# Patient Record
Sex: Male | Born: 1955 | Race: White | Hispanic: No | Marital: Married | Smoking: Former smoker
Health system: Southern US, Community
[De-identification: ages and names within clinical notes are randomized; demographics above are authoritative.]

## PROBLEM LIST (undated history)

## (undated) DIAGNOSIS — E785 Hyperlipidemia, unspecified: Secondary | ICD-10-CM

## (undated) DIAGNOSIS — K219 Gastro-esophageal reflux disease without esophagitis: Secondary | ICD-10-CM

## (undated) DIAGNOSIS — K759 Inflammatory liver disease, unspecified: Secondary | ICD-10-CM

## (undated) DIAGNOSIS — I10 Essential (primary) hypertension: Secondary | ICD-10-CM

## (undated) DIAGNOSIS — G40909 Epilepsy, unspecified, not intractable, without status epilepticus: Secondary | ICD-10-CM

## (undated) DIAGNOSIS — I499 Cardiac arrhythmia, unspecified: Secondary | ICD-10-CM

## (undated) DIAGNOSIS — T7840XA Allergy, unspecified, initial encounter: Secondary | ICD-10-CM

## (undated) HISTORY — DX: Epilepsy, unspecified, not intractable, without status epilepticus: G40.909

## (undated) HISTORY — DX: Hyperlipidemia, unspecified: E78.5

## (undated) HISTORY — DX: Cardiac arrhythmia, unspecified: I49.9

## (undated) HISTORY — DX: Allergy, unspecified, initial encounter: T78.40XA

## (undated) HISTORY — DX: Inflammatory liver disease, unspecified: K75.9

## (undated) HISTORY — DX: Essential (primary) hypertension: I10

## (undated) HISTORY — DX: Gastro-esophageal reflux disease without esophagitis: K21.9

---

## 1997-10-06 ENCOUNTER — Emergency Department (HOSPITAL_COMMUNITY): Admission: EM | Admit: 1997-10-06 | Discharge: 1997-10-06 | Payer: Self-pay | Admitting: Emergency Medicine

## 2007-09-28 ENCOUNTER — Observation Stay (HOSPITAL_COMMUNITY): Admission: EM | Admit: 2007-09-28 | Discharge: 2007-09-29 | Payer: Self-pay | Admitting: Emergency Medicine
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
## 2010-11-22 LAB — COMPREHENSIVE METABOLIC PANEL
AST: 45 — ABNORMAL HIGH
Albumin: 4.1
Alkaline Phosphatase: 63
BUN: 8
Calcium: 9.4
Chloride: 103
Creatinine, Ser: 0.75
Creatinine, Ser: 0.79
GFR calc Af Amer: >60
GFR calc non Af Amer: >60
Glucose, Bld: 87
Potassium: 4.1
Total Bilirubin: 0.7
Total Protein: 7

## 2010-11-22 LAB — CK TOTAL AND CKMB (NOT AT ARMC)
CK, MB: 1.9
CK, MB: 2.3
Relative Index: 0.9
Relative Index: 1
Relative Index: 1.2
Total CK: 212
Total CK: 266 — ABNORMAL HIGH
Total CK: 270 — ABNORMAL HIGH

## 2010-11-22 LAB — CBC
HCT: 43
HCT: 43.8
Hemoglobin: 15.1
MCHC: 35.3
MCV: 93.3
MCV: 95
Platelets: 221
RBC: 4.61
WBC: 7
WBC: 8.6

## 2010-11-22 LAB — DIFFERENTIAL
Basophils Absolute: 0
Basophils Relative: 1
Eosinophils Absolute: 0.2
Eosinophils Relative: 2
Lymphocytes Relative: 36
Lymphs Abs: 2.8
Monocytes Relative: 9
Neutro Abs: 3.8
Neutrophils Relative %: 54

## 2010-11-22 LAB — URINALYSIS, ROUTINE W REFLEX MICROSCOPIC
Ketones, ur: NEGATIVE
Nitrite: NEGATIVE
pH: 6.5

## 2010-11-22 LAB — PROTIME-INR
INR: 0.9
Prothrombin Time: 12.7

## 2010-11-22 LAB — TSH: TSH: 1.305

## 2010-11-22 LAB — POCT CARDIAC MARKERS
Troponin i, poc: <0.05
Troponin i, poc: <0.05

## 2010-11-22 LAB — APTT: aPTT: 28

## 2011-03-29 ENCOUNTER — Ambulatory Visit (INDEPENDENT_AMBULATORY_CARE_PROVIDER_SITE_OTHER): Payer: PRIVATE HEALTH INSURANCE | Admitting: Family Medicine

## 2011-03-29 DIAGNOSIS — R350 Frequency of micturition: Secondary | ICD-10-CM

## 2011-03-29 DIAGNOSIS — R300 Dysuria: Secondary | ICD-10-CM

## 2011-03-29 DIAGNOSIS — Z202 Contact with and (suspected) exposure to infections with a predominantly sexual mode of transmission: Secondary | ICD-10-CM

## 2011-03-29 DIAGNOSIS — M2550 Pain in unspecified joint: Secondary | ICD-10-CM

## 2011-03-29 DIAGNOSIS — I10 Essential (primary) hypertension: Secondary | ICD-10-CM

## 2011-03-29 DIAGNOSIS — B1920 Unspecified viral hepatitis C without hepatic coma: Secondary | ICD-10-CM

## 2011-03-29 DIAGNOSIS — N410 Acute prostatitis: Secondary | ICD-10-CM

## 2011-03-29 DIAGNOSIS — K219 Gastro-esophageal reflux disease without esophagitis: Secondary | ICD-10-CM | POA: Insufficient documentation

## 2011-03-29 DIAGNOSIS — R509 Fever, unspecified: Secondary | ICD-10-CM

## 2011-03-29 DIAGNOSIS — E785 Hyperlipidemia, unspecified: Secondary | ICD-10-CM

## 2011-03-29 LAB — POCT CBC
Granulocyte percent: 65.9 %G (ref 37–80)
HCT, POC: 45.9 % (ref 43.5–53.7)
Hemoglobin: 15.1 g/dL (ref 14.1–18.1)
Lymph, poc: 3.3 (ref 0.6–3.4)
MCH, POC: 31 pg (ref 27–31.2)
MCHC: 32.9 g/dL (ref 31.8–35.4)
MCV: 94.2 fL (ref 80–97)
MID (cbc): 1.6 — AB (ref 0–0.9)
MPV: 8.8 fL (ref 0–99.8)
POC Granulocyte: 9.5 — AB (ref 2–6.9)
POC LYMPH PERCENT: 23.1 % (ref 10–50)
POC MID %: 11 %M (ref 0–12)
Platelet Count, POC: 226 10*3/uL (ref 142–424)
RBC: 4.87 M/uL (ref 4.69–6.13)
RDW, POC: 13.2 %
WBC: 14.4 10*3/uL — AB (ref 4.6–10.2)

## 2011-03-29 LAB — COMPREHENSIVE METABOLIC PANEL
AST: 29 U/L (ref 0–37)
Alkaline Phosphatase: 73 U/L (ref 39–117)
BUN: 14 mg/dL (ref 6–23)
Creat: 1.07 mg/dL (ref 0.50–1.35)
Potassium: 4.5 mEq/L (ref 3.5–5.3)

## 2011-03-29 LAB — COMPREHENSIVE METABOLIC PANEL WITH GFR
ALT: 33 U/L (ref 0–53)
Albumin: 5.1 g/dL (ref 3.5–5.2)
CO2: 30 meq/L (ref 19–32)
Calcium: 9.7 mg/dL (ref 8.4–10.5)
Chloride: 99 meq/L (ref 96–112)
Glucose, Bld: 89 mg/dL (ref 70–99)
Sodium: 138 meq/L (ref 135–145)
Total Bilirubin: 0.7 mg/dL (ref 0.3–1.2)
Total Protein: 8.2 g/dL (ref 6.0–8.3)

## 2011-03-29 LAB — POCT UA - MICROSCOPIC ONLY
Casts, Ur, LPF, POC: NEGATIVE
Crystals, Ur, HPF, POC: NEGATIVE
Mucus, UA: NEGATIVE
Yeast, UA: NEGATIVE

## 2011-03-29 LAB — POCT URINALYSIS DIPSTICK
Bilirubin, UA: NEGATIVE
Blood, UA: NEGATIVE
Glucose, UA: NEGATIVE
Ketones, UA: NEGATIVE
Leukocytes, UA: NEGATIVE
Nitrite, UA: NEGATIVE
Protein, UA: 30
Spec Grav, UA: 1.02
Urobilinogen, UA: 1
pH, UA: 7

## 2011-03-29 MED ORDER — CIPROFLOXACIN HCL 500 MG PO TABS: 500.0000 mg | ORAL_TABLET | Freq: Two times a day (BID) | ORAL | Status: AC

## 2011-03-29 NOTE — Progress Notes (Signed)
  Subjective:    Patient ID: [place] Mcguire, male    DOB: February 14, 1956, 56 y.o.   MRN: [id number]  Urinary Frequency  This is a new problem. The current episode started in the past 7 days. The problem occurs intermittently. The problem has been gradually worsening. The quality of the pain is described as burning and stabbing. The pain is moderate. The maximum temperature recorded prior to his arrival was 101 - 101.9 F. The fever has been present for less than 1 day. He is not sexually active. There is no history of pyelonephritis. Associated symptoms include chills, flank pain, frequency, hesitancy and urgency. Pertinent negatives include no discharge, hematuria, nausea, possible pregnancy or vomiting.  Heartburn He complains of heartburn. He reports no abdominal pain, no belching, no chest pain, no coughing, no nausea or no wheezing. This is a chronic problem. The problem has been unchanged. The heartburn is of mild intensity. The symptoms are aggravated by caffeine, stress and certain foods. Pertinent negatives include no anemia, fatigue, melena, muscle weakness, orthopnea or weight loss. Risk factors include caffeine use. He has tried a PPI for the symptoms. The treatment provided moderate relief.   He is a Naval architect. Married, in a monogamous relationship.  Review of Systems  Constitutional: Positive for chills. Negative for weight loss and fatigue.  Respiratory: Negative for cough, chest tightness, shortness of breath and wheezing.   Cardiovascular: Negative for chest pain and palpitations.  Gastrointestinal: Positive for heartburn. Negative for nausea, vomiting, abdominal pain and melena.  Genitourinary: Positive for dysuria, hesitancy, urgency, frequency and flank pain. Negative for hematuria, discharge, penile swelling, scrotal swelling, genital sores, penile pain and testicular pain.  Musculoskeletal: Negative for muscle weakness.       Objective:   Physical Exam  Constitutional: He  is oriented to person, place, and time. He appears well-developed and well-nourished.  HENT:  Head: Normocephalic and atraumatic.  Right Ear: External ear normal.  Left Ear: External ear normal.  Mouth/Throat: Oropharynx is clear and moist.  Eyes: EOM are normal. No scleral icterus.  Neck: Normal range of motion. Neck supple.  Cardiovascular: Normal rate, regular rhythm and normal heart sounds.  Exam reveals no gallop and no friction rub.   No murmur heard. Pulmonary/Chest: Effort normal and breath sounds normal. No respiratory distress. He has no wheezes. He has no rales. He exhibits no tenderness.  Abdominal: Soft. Bowel sounds are normal. He exhibits no distension and no mass. There is no tenderness. There is no rebound and no guarding.  Musculoskeletal: Normal range of motion.  Neurological: He is alert and oriented to person, place, and time.  Skin: Skin is warm and dry.  Psychiatric: He has a normal Mcguire and affect.    + enlarged prostate, tender, slightly tender epidydimis + stenotic urethral meatus vs just ? Stenotic uncircumcised foreskin      Assessment & Plan:  1. Fever/Chills-CBC showed 14.4 WBC most likely due to acute bacterial prostatitis. Pateint rx Cipro 500 mg BID x 14 days. 2. Dysuria/Frequency-UA with micro normal, will cx 3. H/o Hep C-CMP, aske dpt to return to get full PE 4. STD screening-Pt declnes HIV, will only do RPR, G/C 5. 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

## 2011-03-30 LAB — RPR

## 2011-03-30 NOTE — Patient Instructions (Signed)
Prostatitis Prostatitis is an inflammation (the body's way of reacting to injury and/or infection) of the prostate gland. The prostate gland is a male organ. The gland is about the size and shape of a walnut. The prostate is located just below the bladder. It produces semen, which is a fluid that helps nourish and transport sperm. Prostatitis is the most common urinary tract problem in men younger than age 54. There are 4 categories of prostatitis:  I - Acute bacterial prostatitis.   II - Chronic bacterial prostatitis.   III - Chronic prostatitis and chronic pelvic pain syndrome (CPPS).   Inflammatory.   Non inflammatory.   IV - Asymptomatic inflammatory prostatitis.  Acute and chronic bacterial prostatitis are problems with bacterial infections of the prostate. "Acute" infection is usually a one-time problem. "Chronic" bacterial prostatitis is a condition with recurrent infection. It is usually caused by the same germ(bacteria). CPPS has symptoms similar to prostate infection. However, no infection is actually found. This condition can cause problems of ongoing pain. Currently, it cannot be cured. Treatments are available and aimed at symptom control.  Asymptomatic inflammatory prostatitis has no symptoms. It is a condition where infection-fighting cells are found by chance in the urine. The diagnosis is made most often during an exam for other conditions. Other conditions could be infertility or a high level of PSA (prostate-specific antigen) in the blood. SYMPTOMS  Symptoms can vary depending upon the type of prostatitis that exists. There can also be overlap in symptoms. This can make diagnosis difficult. Symptoms: For Acute bacterial prostatitis  Painful urination.   Fever or chills.   Muscle or joint pains.   Low back pain.   Low abdominal pain.   Inability to empty bladder completely.   Sudden urges to urinate.   Frequent urination during the day.   Difficulty starting  urine stream.   Need to urinate several times at night (nocturia).   Weak urine stream.   Urethral (tube that carries urine from the bladder out of the body) discharge and dribbling after urination.  For Chronic bacterial prostatitis  Rectal pain.   Pain in the testicles, penis, or tip of the penis.   Pain in the space between the anus and scrotum (perineum).   Low back pain.   Low abdominal pain.   Problems with sexual function.   Painful ejaculation.   Bloody semen.   Inability to empty bladder completely.   Painful urination.   Sudden urges to urinate.   Frequent urination during the day.   Difficulty starting urine stream.   Need to urinate several times at night (nocturia).   Weak urine stream.   Dribbling after urination.   Urethral discharge.  For Chronic prostatitis and chronic pelvic pain syndrome (CPPS) Symptoms are the same as those for chronic bacterial prostatitis. Problems with sexual function are often the reason for seeking care. This important problem should be discussed with your caregiver. For Asymptomatic inflammatory prostatitis As noted above, there are no symptoms with this condition. DIAGNOSIS   Your caregiver may perform a rectal exam. This exam is to determine if the prostate is swollen and tender.   Sometimes blood work is performed. This is done to see if your white blood cell count is elevated. The Prostate Specific Antigen (PSA) is also measured. PSA is a blood test that can help detect early prostate cancer.   A urinalysis is done to find out what type of infection is present if this is a suspected cause. An   additional urinalysis may be done after a digital rectal exam. This is to see if white blood cells are pushed out of the prostate and into the urine. A low-grade infection of the prostate may not be found on the first urinalysis.  In more difficult cases, your caregiver may advise other tests. Tests could include:  Urodynamics  -- Tests the function of the bladder and the organs involved in triggering and controlling normal urination.   Urine flow rate.   Cystoscopy -- In this procedure, a thin, telescope-like tube with a light and tiny camera attached (cystoscope) is inserted into the bladder through the urethra. This allows the caregiver to see the inside of the urethra and bladder.   Electromyography -- This procedure tests how the muscles and nerves of the bladder work. It is focused on the muscles that control the anus and pelvic floor. These are the muscles between the anus and scrotum.  In people who show no signs of infection, certain uncommon infections might be causing constant or recurrent symptoms. These uncommon infections are difficult to detect. More work in medicine may help find solutions to these problems. TREATMENT  Antibiotics are used to treat infections caused by germs. If the infection is not treated and becomes long lasting (chronic), it may become a lower grade infection with minor, continual problems. Without treatment, the prostate may develop a boil or furuncle (abscess). This may require surgical treatment. For those with chronic prostatitis and CPPS, it is important to work closely with your primary caregiver and urologist. For some, the medicines that are used to treat a non-cancerous, enlarged prostate (benign prostatic hypertrophy) may be helpful. Referrals to specialists other than urologists may be necessary. In rare cases when all treatments have been inadequate for pain control, an operation to remove the prostate may be recommended. This is very rare and before this is considered thorough discussion with your urologist is highly recommended.  In cases of secondary to chronic non-bacterial prostatitis, a good relationship with your urologist or primary caregiver is essential because it is often a recurrent prolonged condition that requires a good understanding of the causes and a commitment  to therapy aimed at controlling your symptoms. HOME CARE INSTRUCTIONS   Hot sitz baths for 20 minutes, 4 times per day, may help relieve pain.   Non-prescription pain killers may be used as your caregiver recommends if you have no allergies to them. Some illnesses or conditions prevent use of non-prescription drugs. If unsure, check with your caregiver. Take all medications as directed. Take the antibiotics for the prescribed length of time, even if you are feeling better.  SEEK MEDICAL CARE IF:   You have any worsening of the symptoms that originally brought you to your caregiver.   You have an oral temperature above 102 F (38.9 C).   You experience any side effects from medications prescribed.  SEEK IMMEDIATE MEDICAL CARE IF:   You have an oral temperature above 102 F (38.9 C), not controlled by medicine.   You have pain not relieved with medications.   You develop nausea, vomiting, lightheadedness, or have a fainting episode.   You are unable to urinate.   You pass bloody urine or clots.  Document Released: 02/08/2000 Document Revised: 10/23/2010 Document Reviewed: 08/29/2008 [employer] Patient Information 2012 [employer]. 

## 2011-03-31 LAB — GC/CHLAMYDIA PROBE AMP, URINE
Chlamydia, Swab/Urine, PCR: NEGATIVE
GC Probe Amp, Urine: NEGATIVE

## 2011-04-01 LAB — URINE CULTURE: Colony Count: 50000

## 2011-04-12 ENCOUNTER — Telehealth: Payer: Self-pay | Admitting: Family Medicine

## 2011-04-12 NOTE — Telephone Encounter (Signed)
Left a message for patient ot call me back, he did and we spoke about his lab results. He is feeling much better on cipro, he actually went to work. Today is his last day of cipro ( 14/14 days) . He was referred to urology but it is out of network so would like a referral to [facility] on [facility] which would be in network.

## 2012-04-27 ENCOUNTER — Encounter: Payer: Self-pay | Admitting: *Deleted

## 2012-05-24 ENCOUNTER — Encounter: Payer: Self-pay | Admitting: Internal Medicine

## 2012-10-13 ENCOUNTER — Other Ambulatory Visit: Payer: Self-pay | Admitting: Internal Medicine

## 2012-10-13 NOTE — Telephone Encounter (Signed)
Rx was sent to pharmacy electronically. 

## 2012-11-02 ENCOUNTER — Other Ambulatory Visit: Payer: Self-pay | Admitting: Internal Medicine

## 2012-11-03 NOTE — Telephone Encounter (Signed)
Rx was sent to pharmacy electronically. 

## 2012-11-05 ENCOUNTER — Ambulatory Visit: Payer: Self-pay | Admitting: Internal Medicine

## 2012-11-12 ENCOUNTER — Encounter: Payer: Self-pay | Admitting: Internal Medicine

## 2012-11-12 ENCOUNTER — Ambulatory Visit (INDEPENDENT_AMBULATORY_CARE_PROVIDER_SITE_OTHER): Payer: PRIVATE HEALTH INSURANCE | Admitting: Internal Medicine

## 2012-11-12 VITALS — BP 116/84 | HR 65 | Ht 67.5 in | Wt 179.7 lb

## 2012-11-12 DIAGNOSIS — Z79899 Other long term (current) drug therapy: Secondary | ICD-10-CM

## 2012-11-12 DIAGNOSIS — E785 Hyperlipidemia, unspecified: Secondary | ICD-10-CM

## 2012-11-12 DIAGNOSIS — I10 Essential (primary) hypertension: Secondary | ICD-10-CM

## 2012-11-12 MED ORDER — HYDROCHLOROTHIAZIDE 25 MG PO TABS: ORAL_TABLET | ORAL | Status: DC

## 2012-11-12 MED ORDER — SIMVASTATIN 40 MG PO TABS: 40.0000 mg | ORAL_TABLET | Freq: Every evening | ORAL | Status: DC

## 2012-11-12 MED ORDER — LOSARTAN POTASSIUM 50 MG PO TABS: ORAL_TABLET | ORAL | Status: DC

## 2012-11-12 NOTE — Patient Instructions (Addendum)
Your physician wants you to follow-up in: 1 year. You will receive a reminder letter in the mail two months in advance. If you don't receive a letter, please call our office to schedule the follow-up appointment.  Your physician recommends that you return for lab work in the next few weeks. You will need to be fasting for this blood work.

## 2012-11-12 NOTE — Progress Notes (Signed)
OFFICE NOTE  Chief Complaint:  Routine office visit  Primary Care Physician: No primary provider on file.  HPI:  [place] Mcguire is a 57 year old gentleman who I see annually for hypertension which has been well controlled, as well as dyslipidemia. 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 He had managed to lose some weight, however, has gained some of that back. His blood pressures remain stable. He does not do any specific exercise but is active. He denies any chest pain, shortness of breath, palpitations, presyncope, syncopal symptoms.   PMHx:  Past Medical History  Diagnosis Date  . Allergy   . Hypertension   . GERD (gastroesophageal reflux disease)   . Epilepsy   . Hepatitis   . Dyslipidemia   . Dysrhythmia     History reviewed. No pertinent past surgical history.  FAMHx:  Family History  Problem Relation Age of Onset  . Cancer Father     SOCHx:   reports that he quit smoking about 30 years ago. His smoking use included Cigarettes. He has a 2 pack-year smoking history. He has never used smokeless tobacco. He reports that he does not drink alcohol or use illicit drugs.  ALLERGIES:  No Known Allergies  ROS: A comprehensive review of systems was negative.  HOME MEDS: Current Outpatient Prescriptions  Medication Sig Dispense Refill  . aspirin 81 MG tablet Take 81 mg by mouth daily.       Marland Kitchen CALCIUM PO Take by mouth daily.      . fish oil-omega-3 fatty acids 1000 MG capsule Take 1 g by mouth 2 (two) times daily.       . hydrochlorothiazide (HYDRODIURIL) 25 MG tablet TAKE 1 TABLET DAILY  90 tablet  3  . losartan (COZAAR) 50 MG tablet TAKE 1 TABLET DAILY  90 tablet  3  . Multiple Vitamins-Minerals (MULTIVITAMIN WITH MINERALS) tablet Take 1 tablet by mouth daily.      Marland Kitchen omeprazole (PRILOSEC) 10 MG capsule Take 20 mg by mouth 2 (two) times daily.       . simvastatin (ZOCOR) 40 MG tablet Take 1 tablet  (40 mg total) by mouth every evening.  90 tablet  3   No current facility-administered medications for this visit.    LABS/IMAGING: No results found for this or any previous visit (from [place] past 48 hour(s)). No results found.  VITALS: BP 116/84  Pulse 65  Ht 5' 7.5" (1.715 m)  Wt 179 lb 11.2 oz (81.511 kg)  BMI 27.71 kg/m2  EXAM: General appearance: alert and no distress Neck: no adenopathy, no carotid bruit, no JVD, supple, symmetrical, trachea midline and thyroid not enlarged, symmetric, no tenderness/mass/nodules Lungs: clear to auscultation bilaterally Heart: regular rate and rhythm, S1, S2 normal, no murmur, click, rub or gallop Abdomen: soft, non-tender; bowel sounds normal; no masses,  no organomegaly Extremities: extremities normal, atraumatic, no cyanosis or edema Pulses: 2+ and symmetric Skin: Skin color, texture, turgor normal. No rashes or lesions Neurologic: Grossly normal  EKG: Normal sinus rhythm at 65  ASSESSMENT: 1. Hypertension-controlled 2. Dyslipidemia-previously at goal  PLAN: 1.   [place] Mcguire is doing very well and asymptomatic. He's constantly busy lifting furniture is fairly active and denies any chest pain or shortness of breath. He is clothes out of [place] medications although did refill those today. He is also due for repeat blood work including lipid profile and CMP. He does have a  history of some mild liver enzyme elevation in [place] past which may be due to NAFL. We'll go ahead and recheck a CMP as well. Let us see him back annually or sooner as necessary.  [place] Nose, MD, [place]gu[place] Attending Cardiologist [place]  [place] Mcguire C 11/12/2012, 4:33 PM

## 2012-11-19 LAB — COMPREHENSIVE METABOLIC PANEL
ALT: 67 U/L — ABNORMAL HIGH (ref 0–53)
AST: 54 U/L — ABNORMAL HIGH (ref 0–37)
CO2: 29 mEq/L (ref 19–32)
Chloride: 100 mEq/L (ref 96–112)
Creat: 0.94 mg/dL (ref 0.50–1.35)
Sodium: 136 mEq/L (ref 135–145)
Total Bilirubin: 0.7 mg/dL (ref 0.3–1.2)
Total Protein: 7.5 g/dL (ref 6.0–8.3)

## 2012-11-22 LAB — NMR LIPOPROFILE WITH LIPIDS
Cholesterol, Total: 140 mg/dL (ref <200)
HDL Particle Number: 27.7 umol/L — ABNORMAL LOW (ref >=30.5)
HDL-C: 34 mg/dL — ABNORMAL LOW (ref >=40)
LDL (calc): 71 mg/dL (ref <100)
LDL Particle Number: 1670 nmol/L — ABNORMAL HIGH (ref <1000)
LDL Size: 19.7 nm — ABNORMAL LOW (ref >20.5)
LP-IR Score: 66 — ABNORMAL HIGH (ref <=45)
Small LDL Particle Number: 1226 nmol/L — ABNORMAL HIGH (ref <=527)
VLDL Size: 45 nm (ref <=46.6)

## 2012-11-25 ENCOUNTER — Encounter: Payer: Self-pay | Admitting: *Deleted

## 2012-11-25 ENCOUNTER — Telehealth: Payer: Self-pay | Admitting: *Deleted

## 2012-11-25 MED ORDER — ROSUVASTATIN CALCIUM 40 MG PO TABS: 40.0000 mg | ORAL_TABLET | Freq: Every day | ORAL | Status: DC

## 2012-11-25 NOTE — Telephone Encounter (Signed)
Message copied by Lindell Spar on Thu Nov 25, 2012  8:38 AM ------      Message from: Chrystie Nose      Created: Wed Nov 24, 2012  7:18 PM       Cholesterol content is not bad, but particle number remains high. I would recommend either changing to Crestor 40 mg or adding Zetia 10 mg to the Zocor.            Dr. Rennis Golden ------

## 2012-11-25 NOTE — Telephone Encounter (Signed)
Called patient with lab results - informed of need to switch or add medication - patient would like to try Crestor - medication ordered and sent to pharmacy.

## 2013-04-08 ENCOUNTER — Telehealth: Payer: Self-pay | Admitting: Internal Medicine

## 2013-04-08 ENCOUNTER — Other Ambulatory Visit: Payer: Self-pay | Admitting: *Deleted

## 2013-04-08 MED ORDER — ATORVASTATIN CALCIUM 80 MG PO TABS: 80.0000 mg | ORAL_TABLET | Freq: Every day | ORAL | Status: DC

## 2013-04-08 NOTE — Telephone Encounter (Signed)
Pt states he can not afford Crestor. Could DrHilty put him on something else or put him back on Simvastatin please..Marland Kitchen

## 2013-04-08 NOTE — Telephone Encounter (Signed)
Talked to Dr. Rennis GoldenHilty and it was decided to stop Crestor 40mg  and start Atorvastatin 80 mg daily ; Pt. Called and informed and informed and script sent electronically to mail order

## 2013-11-14 ENCOUNTER — Ambulatory Visit: Payer: PRIVATE HEALTH INSURANCE | Admitting: Internal Medicine

## 2013-11-25 ENCOUNTER — Encounter: Payer: Self-pay | Admitting: Internal Medicine

## 2013-11-25 ENCOUNTER — Ambulatory Visit (INDEPENDENT_AMBULATORY_CARE_PROVIDER_SITE_OTHER): Payer: PRIVATE HEALTH INSURANCE | Admitting: Internal Medicine

## 2013-11-25 VITALS — BP 128/86 | HR 69 | Ht 67.0 in | Wt 184.5 lb

## 2013-11-25 DIAGNOSIS — E785 Hyperlipidemia, unspecified: Secondary | ICD-10-CM

## 2013-11-25 DIAGNOSIS — I10 Essential (primary) hypertension: Secondary | ICD-10-CM

## 2013-11-25 DIAGNOSIS — Z79899 Other long term (current) drug therapy: Secondary | ICD-10-CM

## 2013-11-25 NOTE — Patient Instructions (Signed)
Your physician recommends that you return for lab work FASTING  Your physician wants you to follow-up in: 1 year with Dr. Hilty. You will receive a reminder letter in the mail two months in advance. If you don't receive a letter, please call our office to schedule the follow-up appointment.  

## 2013-11-25 NOTE — Progress Notes (Signed)
OFFICE NOTE  Chief Complaint:  Routine office visit  Primary Care Physician: No primary provider on file.  HPI:  [place] Mcguire is a 58 year old gentleman who I see annually for hypertension which has been well controlled, as well as dyslipidemia. 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 He had managed to lose some weight, however, has gained some of that back. His blood pressures remain stable. He does not do any specific exercise but is active. He denies any chest pain, shortness of breath, palpitations, presyncope, syncopal symptoms.   [place] Mcguire returns today for followup. He reports feeling very well. Denies any chest pain or shortness of breath. He reports he started eating a healthier diet and he seemed a little bit of weight loss. He is taking atorvastatin 80 mg now and is due for repeat test of his cholesterol. Blood pressures been well controlled. He reports a history of fatty liver the past and we may need to survey his liver enzymes.  PMHx:  Past Medical History  Diagnosis Date  . Allergy   . Hypertension   . GERD (gastroesophageal reflux disease)   . Epilepsy   . Hepatitis   . Dyslipidemia   . Dysrhythmia     History reviewed. No pertinent past surgical history.  FAMHx:  Family History  Problem Relation Age of Onset  . Cancer Father     SOCHx:   reports that he quit smoking about 31 years ago. His smoking use included Cigarettes. He has a 2 pack-year smoking history. He has never used smokeless tobacco. He reports that he does not drink alcohol or use illicit drugs.  ALLERGIES:  No Known Allergies  ROS: A comprehensive review of systems was negative.  HOME MEDS: Current Outpatient Prescriptions  Medication Sig Dispense Refill  . aspirin 81 MG tablet Take 81 mg by mouth daily.       Marland Kitchen. atorvastatin (LIPITOR) 80 MG tablet Take 1 tablet (80 mg total) by mouth daily.  90 tablet  3  .  CALCIUM PO Take 630 mg by mouth 2 (two) times daily.       . Chlorpheniramine Maleate (ALLERGY PO) Take by mouth at bedtime.      . Cholecalciferol (VITAMIN D-3) 1000 UNITS CAPS Take 1 capsule by mouth daily.      . fish oil-omega-3 fatty acids 1000 MG capsule Take 1 g by mouth 2 (two) times daily.       . hydrochlorothiazide (HYDRODIURIL) 25 MG tablet TAKE 1 TABLET DAILY  90 tablet  3  . losartan (COZAAR) 50 MG tablet TAKE 1 TABLET DAILY  90 tablet  3  . Multiple Vitamins-Minerals (MULTIVITAMIN WITH MINERALS) tablet Take 1 tablet by mouth daily.       No current facility-administered medications for this visit.    LABS/IMAGING: No results found for this or any previous visit (from the past 48 hour(s)). No results found.  VITALS: BP 128/86  Pulse 69  Ht 5\' 7"  (1.702 m)  Wt 184 lb 8 oz (83.689 kg)  BMI 28.89 kg/m2  EXAM: General appearance: alert and no distress Neck: no adenopathy, no carotid bruit, no JVD, supple, symmetrical, trachea midline and thyroid not enlarged, symmetric, no tenderness/mass/nodules Lungs: clear to auscultation bilaterally Heart: regular rate and rhythm, S1, S2 normal, no murmur, click, rub or gallop Abdomen: soft, non-tender; bowel sounds normal; no masses,  no organomegaly Extremities: extremities normal, atraumatic, no  cyanosis or edema Pulses: 2+ and symmetric Skin: Skin color, texture, turgor normal. No rashes or lesions Neurologic: Grossly normal  EKG: Normal sinus rhythm at 69  ASSESSMENT: 1. Hypertension-controlled 2. Dyslipidemia-due for re-check  PLAN: 1.   [place] Mcguire is doing very well and asymptomatic. He's constantly busy lifting furniture is fairly active and denies any chest pain or shortness of breath. As last office visit I increased his cholesterol medication and he is now due for repeat lipid profile. We'll order that as well as a liver profile based on his history of fatty liver in the past to make sure that there's been no  significant elevation in liver enzymes. Blood pressure is well controlled. Overall is doing well and is eating healthier diet and getting more activity. I've encouraged him to keep this up and will plan to see him back annually or sooner as necessary.  Chrystie Nose, MD, [facility] Attending Cardiologist [facility]  Eran Windish C 11/25/2013, 4:31 PM

## 2013-12-02 LAB — HEPATIC FUNCTION PANEL
ALT: 56 U/L — ABNORMAL HIGH (ref 0–53)
AST: 44 U/L — ABNORMAL HIGH (ref 0–37)
Albumin: 4.4 g/dL (ref 3.5–5.2)
Alkaline Phosphatase: 57 U/L (ref 39–117)
BILIRUBIN DIRECT: 0.1 mg/dL (ref 0.0–0.3)
BILIRUBIN INDIRECT: 0.5 mg/dL (ref 0.2–1.2)
Total Bilirubin: 0.6 mg/dL (ref 0.2–1.2)
Total Protein: 7.2 g/dL (ref 6.0–8.3)

## 2013-12-05 ENCOUNTER — Encounter: Payer: Self-pay | Admitting: *Deleted

## 2013-12-05 LAB — NMR LIPOPROFILE WITH LIPIDS
CHOLESTEROL, TOTAL: 126 mg/dL (ref 100–199)
HDL PARTICLE NUMBER: 27.5 umol/L — AB (ref >=30.5)
HDL Size: 8.7 nm — ABNORMAL LOW (ref >=9.2)
HDL-C: 33 mg/dL — ABNORMAL LOW (ref >39)
LDL CALC: 62 mg/dL (ref 0–99)
LDL Particle Number: 964 nmol/L (ref <1000)
LDL Size: 19.9 nm (ref >=20.8)
LP-IR SCORE: 71 — AB (ref <=45)
Large HDL-P: 2.4 umol/L — ABNORMAL LOW (ref >=4.8)
Large VLDL-P: 4.6 nmol/L — ABNORMAL HIGH (ref <=2.7)
Small LDL Particle Number: 619 nmol/L — ABNORMAL HIGH (ref <=527)
Triglycerides: 157 mg/dL — ABNORMAL HIGH (ref 0–149)
VLDL SIZE: 48.3 nm — AB (ref <=46.6)

## 2013-12-12 ENCOUNTER — Other Ambulatory Visit: Payer: Self-pay | Admitting: Internal Medicine

## 2013-12-12 NOTE — Telephone Encounter (Signed)
Rx was sent to pharmacy electronically. 

## 2013-12-14 ENCOUNTER — Other Ambulatory Visit: Payer: Self-pay | Admitting: Internal Medicine

## 2013-12-14 NOTE — Telephone Encounter (Signed)
Rx was sent to pharmacy electronically. 

## 2014-02-16 ENCOUNTER — Other Ambulatory Visit: Payer: Self-pay | Admitting: Internal Medicine

## 2014-02-16 NOTE — Telephone Encounter (Signed)
Rx refill sent to patient pharmacy   

## 2014-11-14 ENCOUNTER — Other Ambulatory Visit: Payer: Self-pay | Admitting: Internal Medicine

## 2014-11-14 NOTE — Telephone Encounter (Signed)
Rx request sent to pharmacy.  

## 2014-11-24 ENCOUNTER — Other Ambulatory Visit: Payer: Self-pay | Admitting: Internal Medicine

## 2014-11-24 NOTE — Telephone Encounter (Signed)
Rx request sent to pharmacy.  

## 2014-12-11 ENCOUNTER — Telehealth: Payer: Self-pay | Admitting: Internal Medicine

## 2014-12-14 ENCOUNTER — Ambulatory Visit: Payer: PRIVATE HEALTH INSURANCE | Admitting: Internal Medicine

## 2014-12-14 NOTE — Telephone Encounter (Signed)
Close encounter 

## 2014-12-22 ENCOUNTER — Encounter: Payer: Self-pay | Admitting: Internal Medicine

## 2014-12-22 ENCOUNTER — Ambulatory Visit (INDEPENDENT_AMBULATORY_CARE_PROVIDER_SITE_OTHER): Payer: PRIVATE HEALTH INSURANCE | Admitting: Internal Medicine

## 2014-12-22 VITALS — BP 116/82 | HR 68 | Ht 66.0 in | Wt 183.5 lb

## 2014-12-22 DIAGNOSIS — I10 Essential (primary) hypertension: Secondary | ICD-10-CM | POA: Diagnosis not present

## 2014-12-22 DIAGNOSIS — E785 Hyperlipidemia, unspecified: Secondary | ICD-10-CM

## 2014-12-22 DIAGNOSIS — I4510 Unspecified right bundle-branch block: Secondary | ICD-10-CM

## 2014-12-22 DIAGNOSIS — K219 Gastro-esophageal reflux disease without esophagitis: Secondary | ICD-10-CM

## 2014-12-22 DIAGNOSIS — Z79899 Other long term (current) drug therapy: Secondary | ICD-10-CM | POA: Diagnosis not present

## 2014-12-22 LAB — LIPID PANEL
CHOL/HDL RATIO: 4.2 ratio (ref <=5.0)
CHOLESTEROL: 126 mg/dL (ref 125–200)
HDL: 30 mg/dL — AB (ref >=40)
LDL Cholesterol: 66 mg/dL (ref <130)
TRIGLYCERIDES: 152 mg/dL — AB (ref <150)
VLDL: 30 mg/dL (ref <30)

## 2014-12-22 LAB — COMPREHENSIVE METABOLIC PANEL
ALBUMIN: 4.5 g/dL (ref 3.6–5.1)
ALK PHOS: 66 U/L (ref 40–115)
ALT: 46 U/L (ref 9–46)
AST: 46 U/L — ABNORMAL HIGH (ref 10–35)
BUN: 17 mg/dL (ref 7–25)
CALCIUM: 9.4 mg/dL (ref 8.6–10.3)
CO2: 28 mmol/L (ref 20–31)
Chloride: 101 mmol/L (ref 98–110)
Creat: 0.92 mg/dL (ref 0.70–1.33)
Glucose, Bld: 103 mg/dL — ABNORMAL HIGH (ref 65–99)
POTASSIUM: 3.9 mmol/L (ref 3.5–5.3)
Sodium: 138 mmol/L (ref 135–146)
TOTAL PROTEIN: 7.2 g/dL (ref 6.1–8.1)
Total Bilirubin: 0.8 mg/dL (ref 0.2–1.2)

## 2014-12-22 NOTE — Patient Instructions (Addendum)
Your physician recommends that you return for lab work FASTING (nothing to eat/drink after midnight)  Your physician wants you to follow-up in: 12 month with Dr. Rennis GoldenHilty. You will receive a reminder letter in the mail two months in advance. If you don't receive a letter, please call our office to schedule the follow-up appointment.  PCP - [facility] - Dr. Drue NovelPaz (if taking new patients)

## 2014-12-22 NOTE — Progress Notes (Signed)
OFFICE NOTE  Chief Complaint:  Routine office visit  Primary Care Physician: No PCP Per Patient  HPI:  Douglas Mcguire is a 59 year old gentleman who I see annually for hypertension which has been well controlled, as well as dyslipidemia. 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 He had managed to lose some weight, however, has gained some of that back. His blood pressures remain stable. He does not do any specific exercise but is active. He denies any chest pain, shortness of breath, palpitations, presyncope, syncopal symptoms.   Douglas Mcguire returns today for followup. He reports feeling very well. Denies any chest pain or shortness of breath. He reports he started eating a healthier diet and he seemed a little bit of weight loss. He is taking atorvastatin 80 mg now and is due for repeat test of his cholesterol. Blood pressures been well controlled. He reports a history of fatty liver the past and we may need to survey his liver enzymes.  I had the pleasure of seeing Douglas Mcguire back in the office today for follow-up. Overall he is doing well denies any chest pain or shortness of breath. Occasionally he sees some black dots, which sound like maybe floaters. He has had good control of his blood pressure. She's on Lipitor 80 and had a particle number less than 1000 at his last office visit. He is due for repeat blood work.  PMHx:  Past Medical History  Diagnosis Date  . Allergy   . Hypertension   . GERD (gastroesophageal reflux disease)   . Epilepsy (HCC)   . Hepatitis   . Dyslipidemia   . Dysrhythmia     No past surgical history on file.  FAMHx:  Family History  Problem Relation Age of Onset  . Cancer Father     SOCHx:   reports that he quit smoking about 32 years ago. His smoking use included Cigarettes. He has a 2 pack-year smoking history. He has never used smokeless tobacco. He reports that he does not  drink alcohol or use illicit drugs.  ALLERGIES:  No Known Allergies  ROS: A comprehensive review of systems was negative.  HOME MEDS: Current Outpatient Prescriptions  Medication Sig Dispense Refill  . aspirin 81 MG tablet Take 81 mg by mouth daily.     Marland Kitchen. atorvastatin (LIPITOR) 80 MG tablet TAKE 1 TABLET DAILY 30 tablet 1  . fish oil-omega-3 fatty acids 1000 MG capsule Take 1 g by mouth 2 (two) times daily.     . hydrochlorothiazide (HYDRODIURIL) 25 MG tablet TAKE 1 TABLET DAILY 90 tablet 0  . losartan (COZAAR) 50 MG tablet TAKE 1 TABLET DAILY 30 tablet 0  . MAGNESIUM PO Take by mouth daily.    . Multiple Vitamins-Minerals (MULTIVITAMIN WITH MINERALS) tablet Take 1 tablet by mouth daily.    . ranitidine (ZANTAC) 150 MG capsule Take 150 mg by mouth 2 (two) times daily.    Marland Kitchen. VALERIAN ROOT PO Take by mouth daily.     No current facility-administered medications for this visit.    LABS/IMAGING: No results found for this or any previous visit (from the past 48 hour(s)). No results found.  VITALS: BP 116/82 mmHg  Pulse 68  Ht 5\' 6"  (1.676 m)  Wt 183 lb 8 oz (83.235 kg)  BMI 29.63 kg/m2  EXAM: General appearance: alert and no distress Neck: no adenopathy, no carotid bruit, no JVD, supple, symmetrical,  trachea midline and thyroid not enlarged, symmetric, no tenderness/mass/nodules Lungs: clear to auscultation bilaterally Heart: regular rate and rhythm, S1, S2 normal, no murmur, click, rub or gallop Abdomen: soft, non-tender; bowel sounds normal; no masses,  no organomegaly Extremities: extremities normal, atraumatic, no cyanosis or edema Pulses: 2+ and symmetric Skin: Skin color, texture, turgor normal. No rashes or lesions Neurologic: Grossly normal  EKG: Normal sinus rhythm at 69, right bundle branch block  ASSESSMENT: 1. Hypertension-controlled 2. Dyslipidemia-due for re-check 3. Right bundle branch block-new  PLAN: 1.   Douglas Mcguire is doing very well and  asymptomatic. There is been interim development of a right bundle branch block, but he is asymptomatic with this. He's constantly busy lifting furniture is fairly active and denies any chest pain or shortness of breath. He is now due for repeat lipid profile. We'll order that as well as a liver profile based on his history of fatty liver in the past to make sure that there's been no significant elevation in liver enzymes. Blood pressure is well controlled. Overall is doing well and is eating healthier diet and getting more activity. I've encouraged him to keep this up and will plan to see him back annually or sooner as necessary. I've given him a referral for primary care provider at med center in [facility] per his request with [employer] [facility].  Chrystie Nose, MD, [facility] Attending Cardiologist [facility]   Chrystie Nose 12/22/2014, 9:32 AM

## 2014-12-25 ENCOUNTER — Encounter: Payer: Self-pay | Admitting: *Deleted

## 2015-02-20 ENCOUNTER — Other Ambulatory Visit: Payer: Self-pay | Admitting: Internal Medicine

## 2015-02-20 NOTE — Telephone Encounter (Signed)
Rx request sent to pharmacy.  

## 2015-02-27 ENCOUNTER — Other Ambulatory Visit: Payer: Self-pay | Admitting: Internal Medicine

## 2015-02-27 NOTE — Telephone Encounter (Signed)
Rx request sent to pharmacy.  

## 2015-03-14 ENCOUNTER — Other Ambulatory Visit: Payer: Self-pay | Admitting: Internal Medicine

## 2015-03-14 MED ORDER — LOSARTAN POTASSIUM 50 MG PO TABS: 50.0000 mg | ORAL_TABLET | Freq: Every day | ORAL | Status: DC

## 2015-03-14 MED ORDER — ATORVASTATIN CALCIUM 80 MG PO TABS: 80.0000 mg | ORAL_TABLET | Freq: Every day | ORAL | Status: DC

## 2015-03-14 NOTE — Telephone Encounter (Signed)
Rx(s) sent to pharmacy electronically.  

## 2015-03-14 NOTE — Telephone Encounter (Signed)
°*  STAT* If patient is at the pharmacy, call can be transferred to refill team.   1. Which medications need to be refilled? (please list name of each medication and dose if known)Atorvastatin   2. Which pharmacy/location (including street and city if local pharmacy) is medication to be sent to?[facility] *  3. Do they need a 30 day or 90 day supply? 90

## 2015-08-02 ENCOUNTER — Other Ambulatory Visit: Payer: Self-pay | Admitting: Internal Medicine

## 2015-08-03 NOTE — Telephone Encounter (Signed)
Rx(s) sent to pharmacy electronically.  

## 2015-11-01 ENCOUNTER — Other Ambulatory Visit: Payer: Self-pay | Admitting: Internal Medicine

## 2015-12-10 ENCOUNTER — Telehealth: Payer: Self-pay | Admitting: Internal Medicine

## 2015-12-10 NOTE — Telephone Encounter (Signed)
Closed encounter °

## 2015-12-14 ENCOUNTER — Ambulatory Visit: Payer: PRIVATE HEALTH INSURANCE | Admitting: Internal Medicine

## 2016-01-25 ENCOUNTER — Encounter: Payer: Self-pay | Admitting: Internal Medicine

## 2016-01-25 ENCOUNTER — Ambulatory Visit (INDEPENDENT_AMBULATORY_CARE_PROVIDER_SITE_OTHER): Payer: PRIVATE HEALTH INSURANCE | Admitting: Internal Medicine

## 2016-01-25 VITALS — BP 118/72 | HR 61 | Ht 66.0 in | Wt 191.2 lb

## 2016-01-25 DIAGNOSIS — E785 Hyperlipidemia, unspecified: Secondary | ICD-10-CM

## 2016-01-25 DIAGNOSIS — Z131 Encounter for screening for diabetes mellitus: Secondary | ICD-10-CM | POA: Diagnosis not present

## 2016-01-25 DIAGNOSIS — I10 Essential (primary) hypertension: Secondary | ICD-10-CM

## 2016-01-25 DIAGNOSIS — Z1211 Encounter for screening for malignant neoplasm of colon: Secondary | ICD-10-CM

## 2016-01-25 DIAGNOSIS — Z125 Encounter for screening for malignant neoplasm of prostate: Secondary | ICD-10-CM | POA: Diagnosis not present

## 2016-01-25 LAB — COMPREHENSIVE METABOLIC PANEL
ALK PHOS: 68 U/L (ref 40–115)
ALT: 56 U/L — AB (ref 9–46)
AST: 44 U/L — AB (ref 10–35)
Albumin: 4.2 g/dL (ref 3.6–5.1)
BILIRUBIN TOTAL: 0.7 mg/dL (ref 0.2–1.2)
BUN: 14 mg/dL (ref 7–25)
CO2: 30 mmol/L (ref 20–31)
Calcium: 9 mg/dL (ref 8.6–10.3)
Chloride: 104 mmol/L (ref 98–110)
Creat: 0.87 mg/dL (ref 0.70–1.33)
GLUCOSE: 96 mg/dL (ref 65–99)
Potassium: 4.2 mmol/L (ref 3.5–5.3)
SODIUM: 140 mmol/L (ref 135–146)
Total Protein: 6.7 g/dL (ref 6.1–8.1)

## 2016-01-25 LAB — CBC
HCT: 41.5 % (ref 38.5–50.0)
Hemoglobin: 14.3 g/dL (ref 13.2–17.1)
MCH: 31.5 pg (ref 27.0–33.0)
MCHC: 34.5 g/dL (ref 32.0–36.0)
MCV: 91.4 fL (ref 80.0–100.0)
MPV: 9.9 fL (ref 7.5–12.5)
PLATELETS: 150 10*3/uL (ref 140–400)
RBC: 4.54 MIL/uL (ref 4.20–5.80)
RDW: 13.8 % (ref 11.0–15.0)
WBC: 6.6 10*3/uL (ref 3.8–10.8)

## 2016-01-25 LAB — LIPID PANEL
Cholesterol: 109 mg/dL
HDL: 26 mg/dL — ABNORMAL LOW
LDL Cholesterol: 38 mg/dL
Total CHOL/HDL Ratio: 4.2 ratio
Triglycerides: 224 mg/dL — ABNORMAL HIGH
VLDL: 45 mg/dL — ABNORMAL HIGH

## 2016-01-25 NOTE — Progress Notes (Signed)
OFFICE NOTE  Chief Complaint:  Routine office visit  Primary Care Physician: No PCP Per Patient  HPI:  Douglas Mcguire is a 60 year old gentleman who I see annually for hypertension which has been well controlled, as well as dyslipidemia. 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 He had managed to lose some weight, however, has gained some of that back. His blood pressures remain stable. He does not do any specific exercise but is active. He denies any chest pain, shortness of breath, palpitations, presyncope, syncopal symptoms.   Douglas Mcguire returns today for followup. He reports feeling very well. Denies any chest pain or shortness of breath. He reports he started eating a healthier diet and he seemed a little bit of weight loss. He is taking atorvastatin 80 mg now and is due for repeat test of his cholesterol. Blood pressures been well controlled. He reports a history of fatty liver the past and we may need to survey his liver enzymes.  I had the pleasure of seeing Douglas Mcguire back in the office today for follow-up. Overall he is doing well denies any chest pain or shortness of breath. Occasionally he sees some black dots, which sound like maybe floaters. He has had good control of his blood pressure. She's on Lipitor 80 and had a particle number less than 1000 at his last office visit. He is due for repeat blood work.  01/25/2016  Douglas Mcguire returns today for follow-up. He has been asymptomatic over the past year. He denies chest pain or worsening shortness of breath. He is due for refills of medication. He does not have a PCP. He has not had a screening colonoscopy or any routine bloodwork. He has a stable RBBB which was new last year, but has been asymptomatic with this.  PMHx:  Past Medical History:  Diagnosis Date  . Allergy   . Dyslipidemia   . Dysrhythmia   . Epilepsy (HCC)   . GERD (gastroesophageal reflux  disease)   . Hepatitis   . Hypertension     No past surgical history on file.  FAMHx:  Family History  Problem Relation Age of Onset  . Cancer Father     SOCHx:   reports that he quit smoking about 33 years ago. His smoking use included Cigarettes. He has a 2.00 pack-year smoking history. He has never used smokeless tobacco. He reports that he does not drink alcohol or use drugs.  ALLERGIES:  No Known Allergies  ROS: Pertinent items noted in HPI and remainder of comprehensive ROS otherwise negative.  HOME MEDS: Current Outpatient Prescriptions  Medication Sig Dispense Refill  . aspirin 81 MG tablet Take 81 mg by mouth daily.     Marland Kitchen. atorvastatin (LIPITOR) 80 MG tablet TAKE 1 TABLET DAILY (CONTACT OFFICE FOR MORE REFILLS) 90 tablet 0  . fish oil-omega-3 fatty acids 1000 MG capsule Take 1 g by mouth 2 (two) times daily.     . hydrochlorothiazide (HYDRODIURIL) 25 MG tablet TAKE 1 TABLET DAILY (KEEP APPOINTMENT WITH DR. Jaxin Fulfer) 90 tablet 2  . losartan (COZAAR) 50 MG tablet Take 1 tablet (50 mg total) by mouth daily. 90 tablet 3  . MAGNESIUM PO Take by mouth daily.    . Multiple Vitamins-Minerals (MULTIVITAMIN WITH MINERALS) tablet Take 1 tablet by mouth daily.    . ranitidine (ZANTAC) 150 MG capsule Take 150 mg by mouth 2 (two) times daily.    .Marland Kitchen  VALERIAN ROOT PO Take by mouth daily.     No current facility-administered medications for this visit.     LABS/IMAGING: No results found for this or any previous visit (from the past 48 hour(s)). No results found.  VITALS: BP 118/72   Pulse 61   Ht 5\' 6"  (1.676 m)   Wt 191 lb 3.2 oz (86.7 kg)   BMI 30.86 kg/m   EXAM: General appearance: alert and no distress Neck: no adenopathy, no carotid bruit, no JVD, supple, symmetrical, trachea midline and thyroid not enlarged, symmetric, no tenderness/mass/nodules Lungs: clear to auscultation bilaterally Heart: regular rate and rhythm, S1, S2 normal, no murmur, click, rub or  gallop Abdomen: soft, non-tender; bowel sounds normal; no masses,  no organomegaly Extremities: extremities normal, atraumatic, no cyanosis or edema Pulses: 2+ and symmetric Skin: Skin color, texture, turgor normal. No rashes or lesions Neurologic: Grossly normal  EKG: Normal sinus rhythm at 61, right bundle branch block  ASSESSMENT: 1. Hypertension-controlled 2. Dyslipidemia 3. Right bundle branch block-new  PLAN: 1.   Douglas Mcguire is doing very well and asymptomatic. He is due for repeat bloodwork of cholesterol and since he does not have a  PCP, he will need screening blood work as well. He has never had a colonscopy - I have referred him to Dr. Elnoria HowardHung for this regarding screening. Plan follow-up annually or sooner as necessary.  Chrystie NoseKenneth C. Celeste Tavenner, MD, [facility] Attending Cardiologist [facility]   Chrystie NoseKenneth C Inell Mimbs 01/25/2016, 7:57 AM

## 2016-01-25 NOTE — Patient Instructions (Signed)
Medication Instructions:   NO CHANGE  Labwork:  Your physician recommends that you HAVE LAB WORK TODAY  Follow-Up:  Your physician wants you to follow-up in: ONE YEAR WITH DR HILTY You will receive a reminder letter in the mail two months in advance. If you don't receive a letter, please call our office to schedule the follow-up appointment.   If you need a refill on your cardiac medications before your next appointment, please call your pharmacy.

## 2016-01-26 LAB — PSA: PSA: 0.6 ng/mL (ref <=4.0)

## 2016-01-26 LAB — HEMOGLOBIN A1C
HEMOGLOBIN A1C: 5.7 % — AB (ref <5.7)
Mean Plasma Glucose: 117 mg/dL

## 2016-01-28 ENCOUNTER — Encounter: Payer: Self-pay | Admitting: *Deleted

## 2016-01-30 ENCOUNTER — Other Ambulatory Visit: Payer: Self-pay | Admitting: Internal Medicine

## 2016-01-30 NOTE — Telephone Encounter (Signed)
Rx(s) sent to pharmacy electronically.  

## 2016-01-31 ENCOUNTER — Telehealth: Payer: Self-pay | Admitting: Internal Medicine

## 2016-01-31 NOTE — Telephone Encounter (Signed)
Called Dr. Rolla EtienneHungs office and found out that the patient no showed his appt for 01-30-16.  Dr. Elnoria HowardHung only sees patients on Monday and Wednesday.  I left a message at the patients home to call me back and gave him my number.

## 2016-02-05 ENCOUNTER — Ambulatory Visit: Payer: PRIVATE HEALTH INSURANCE | Admitting: Internal Medicine

## 2016-02-24 ENCOUNTER — Other Ambulatory Visit: Payer: Self-pay | Admitting: Internal Medicine

## 2016-03-20 ENCOUNTER — Other Ambulatory Visit: Payer: Self-pay | Admitting: Internal Medicine

## 2016-03-20 NOTE — Telephone Encounter (Signed)
Rx request sent to pharmacy.  

## 2016-03-25 ENCOUNTER — Other Ambulatory Visit: Payer: Self-pay

## 2016-03-25 MED ORDER — LOSARTAN POTASSIUM 50 MG PO TABS
50.0000 mg | ORAL_TABLET | Freq: Every day | ORAL | 3 refills | Status: DC
Start: 2016-03-25 — End: 2017-02-06

## 2016-03-25 MED ORDER — LOSARTAN POTASSIUM 50 MG PO TABS: 50.0000 mg | ORAL_TABLET | Freq: Every day | ORAL | 11 refills | Status: DC

## 2016-03-25 NOTE — Telephone Encounter (Signed)
Rx(s) sent to pharmacy electronically.  

## 2017-01-26 ENCOUNTER — Other Ambulatory Visit: Payer: Self-pay | Admitting: Internal Medicine

## 2017-02-06 ENCOUNTER — Encounter: Payer: Self-pay | Admitting: Internal Medicine

## 2017-02-06 ENCOUNTER — Ambulatory Visit: Payer: PRIVATE HEALTH INSURANCE | Admitting: Internal Medicine

## 2017-02-06 VITALS — BP 102/80 | HR 71 | Ht 66.0 in | Wt 184.0 lb

## 2017-02-06 DIAGNOSIS — I10 Essential (primary) hypertension: Secondary | ICD-10-CM

## 2017-02-06 DIAGNOSIS — Z125 Encounter for screening for malignant neoplasm of prostate: Secondary | ICD-10-CM

## 2017-02-06 DIAGNOSIS — I4510 Unspecified right bundle-branch block: Secondary | ICD-10-CM

## 2017-02-06 DIAGNOSIS — Z79899 Other long term (current) drug therapy: Secondary | ICD-10-CM | POA: Diagnosis not present

## 2017-02-06 DIAGNOSIS — Z131 Encounter for screening for diabetes mellitus: Secondary | ICD-10-CM

## 2017-02-06 DIAGNOSIS — E785 Hyperlipidemia, unspecified: Secondary | ICD-10-CM

## 2017-02-06 MED ORDER — HYDROCHLOROTHIAZIDE 25 MG PO TABS: 25.0000 mg | ORAL_TABLET | Freq: Every day | ORAL | 3 refills | Status: DC

## 2017-02-06 MED ORDER — LOSARTAN POTASSIUM 50 MG PO TABS: 50.0000 mg | ORAL_TABLET | Freq: Every day | ORAL | 3 refills | Status: DC

## 2017-02-06 NOTE — Patient Instructions (Signed)
Your physician recommends that you return for lab work TODAY  Your physician wants you to follow-up in: 12 months with Dr. Rennis GoldenHilty. You will receive a reminder letter in the mail two months in advance. If you don't receive a letter, please call our office to schedule the follow-up appointment.

## 2017-02-07 ENCOUNTER — Other Ambulatory Visit: Payer: Self-pay | Admitting: Internal Medicine

## 2017-02-07 ENCOUNTER — Encounter: Payer: Self-pay | Admitting: Internal Medicine

## 2017-02-07 LAB — COMPREHENSIVE METABOLIC PANEL
A/G RATIO: 1.7 (ref 1.2–2.2)
ALT: 37 IU/L (ref 0–44)
AST: 35 IU/L (ref 0–40)
Albumin: 4.4 g/dL (ref 3.5–5.5)
Alkaline Phosphatase: 72 IU/L (ref 39–117)
BILIRUBIN TOTAL: 0.6 mg/dL (ref 0.0–1.2)
BUN/Creatinine Ratio: 20 (ref 9–20)
BUN: 19 mg/dL (ref 6–24)
CHLORIDE: 100 mmol/L (ref 96–106)
CO2: 23 mmol/L (ref 20–29)
Calcium: 9.6 mg/dL (ref 8.7–10.2)
Creatinine, Ser: 0.95 mg/dL (ref 0.76–1.27)
GFR calc Af Amer: 101 mL/min/{1.73_m2} (ref >59)
GFR, EST NON AFRICAN AMERICAN: 87 mL/min/{1.73_m2} (ref >59)
GLOBULIN, TOTAL: 2.6 g/dL (ref 1.5–4.5)
Glucose: 100 mg/dL — ABNORMAL HIGH (ref 65–99)
POTASSIUM: 4 mmol/L (ref 3.5–5.2)
SODIUM: 140 mmol/L (ref 134–144)
Total Protein: 7 g/dL (ref 6.0–8.5)

## 2017-02-07 LAB — HEMOGLOBIN A1C
ESTIMATED AVERAGE GLUCOSE: 134 mg/dL
HEMOGLOBIN A1C: 6.3 % — AB (ref 4.8–5.6)

## 2017-02-07 LAB — LIPID PANEL
CHOLESTEROL TOTAL: 120 mg/dL (ref 100–199)
Chol/HDL Ratio: 3.5 ratio (ref 0.0–5.0)
HDL: 34 mg/dL — ABNORMAL LOW (ref >39)
LDL CALC: 57 mg/dL (ref 0–99)
Triglycerides: 146 mg/dL (ref 0–149)
VLDL CHOLESTEROL CAL: 29 mg/dL (ref 5–40)

## 2017-02-07 LAB — CBC
HEMATOCRIT: 39.5 % (ref 37.5–51.0)
Hemoglobin: 13.2 g/dL (ref 13.0–17.7)
MCH: 28.3 pg (ref 26.6–33.0)
MCHC: 33.4 g/dL (ref 31.5–35.7)
MCV: 85 fL (ref 79–97)
PLATELETS: 183 10*3/uL (ref 150–379)
RBC: 4.67 x10E6/uL (ref 4.14–5.80)
RDW: 16.5 % — ABNORMAL HIGH (ref 12.3–15.4)
WBC: 6 10*3/uL (ref 3.4–10.8)

## 2017-02-07 LAB — PSA: PROSTATE SPECIFIC AG, SERUM: 1.2 ng/mL (ref 0.0–4.0)

## 2017-02-07 NOTE — Progress Notes (Signed)
OFFICE NOTE  Chief Complaint:  Yearly visit  Primary Care Physician: Patient, No Pcp Per  HPI:  Douglas Mcguire is a 61 year old gentleman who I see annually for hypertension which has been well controlled, as well as dyslipidemia. 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 He had managed to lose some weight, however, has gained some of that back. His blood pressures remain stable. He does not do any specific exercise but is active. He denies any chest pain, shortness of breath, palpitations, presyncope, syncopal symptoms.   Douglas Mcguire returns today for followup. He reports feeling very well. Denies any chest pain or shortness of breath. He reports he started eating a healthier diet and he seemed a little bit of weight loss. He is taking atorvastatin 80 mg now and is due for repeat test of his cholesterol. Blood pressures been well controlled. He reports a history of fatty liver the past and we may need to survey his liver enzymes.  I had the pleasure of seeing Douglas Mcguire back in the office today for follow-up. Overall he is doing well denies any chest pain or shortness of breath. Occasionally he sees some black dots, which sound like maybe floaters. He has had good control of his blood pressure. She's on Lipitor 80 and had a particle number less than 1000 at his last office visit. He is due for repeat blood work.  01/25/2016  Douglas Mcguire returns today for follow-up. He has been asymptomatic over the past year. He denies chest pain or worsening shortness of breath. He is due for refills of medication. He does not have a PCP. He has not had a screening colonoscopy or any routine bloodwork. He has a stable RBBB which was new last year, but has been asymptomatic with this.  02/06/2017  Douglas Mcguire was seen today in follow-up.  This is his annual visit.  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  He works for a company that [place]  He denies any chest pain or worsening shortness of breath with this activity.  He is still not settled with a PCP.  I had referred him for a screening colonoscopy but he I did not make that appointment.  He was asking about the possibility of doing Cologuard testing.  I said it would be best for him to establish with a PCP with regards to that as we do not carry the kids.  He is fasting today and interested in doing annual blood work.  EKG shows sinus rhythm with stable right bundle branch block.  PMHx:  Past Medical History:  Diagnosis Date  . Allergy   . Dyslipidemia   . Dysrhythmia   . Epilepsy (HCC)   . GERD (gastroesophageal reflux disease)   . Hepatitis   . Hypertension     No past surgical history on file.  FAMHx:  Family History  Problem Relation Age of Onset  . Cancer Father     SOCHx:   reports that he quit smoking about 34 years ago. His smoking use included cigarettes. He has a 2.00 pack-year smoking history. he has never used smokeless tobacco. He reports that he does not drink alcohol or use drugs.  ALLERGIES:  No Known Allergies  ROS: Pertinent items noted in HPI and remainder of comprehensive ROS otherwise negative.  HOME MEDS:  Current Outpatient Medications  Medication Sig Dispense Refill  . aspirin 81 MG tablet Take 81 mg by mouth daily.     Marland Kitchen atorvastatin (LIPITOR) 80 MG tablet TAKE 1 TABLET DAILY AT 6 P.M. 90 tablet 3  . fish oil-omega-3 fatty acids 1000 MG capsule Take 1 g by mouth 2 (two) times daily.     . hydrochlorothiazide (HYDRODIURIL) 25 MG tablet Take 1 tablet (25 mg total) by mouth daily. 90 tablet 3  . losartan (COZAAR) 50 MG tablet Take 1 tablet (50 mg total) by mouth daily. 90 tablet 3  . MAGNESIUM PO Take by mouth daily.    . Multiple Vitamins-Minerals (MULTIVITAMIN WITH MINERALS) tablet Take 1 tablet by mouth daily.    . ranitidine (ZANTAC) 150 MG capsule  Take 150 mg by mouth 2 (two) times daily.    Marland Kitchen VALERIAN ROOT PO Take by mouth daily.     No current facility-administered medications for this visit.     LABS/IMAGING: Results for orders placed or performed in visit on 02/06/17 (from the past 48 hour(s))  CBC     Status: Abnormal   Collection Time: 02/06/17  8:23 AM  Result Value Ref Range   WBC 6.0 3.4 - 10.8 x10E3/uL   RBC 4.67 4.14 - 5.80 x10E6/uL   Hemoglobin 13.2 13.0 - 17.7 g/dL   Hematocrit 16.1 09.6 - 51.0 %   MCV 85 79 - 97 fL   MCH 28.3 26.6 - 33.0 pg   MCHC 33.4 31.5 - 35.7 g/dL   RDW 04.5 (H) 40.9 - 81.1 %   Platelets 183 150 - 379 x10E3/uL  Comprehensive metabolic panel     Status: Abnormal   Collection Time: 02/06/17  8:23 AM  Result Value Ref Range   Glucose 100 (H) 65 - 99 mg/dL   BUN 19 6 - 24 mg/dL   Creatinine, Ser 9.14 0.76 - 1.27 mg/dL   GFR calc non Af Amer 87 >59 mL/min/1.73   GFR calc Af Amer 101 >59 mL/min/1.73   BUN/Creatinine Ratio 20 9 - 20   Sodium 140 134 - 144 mmol/L   Potassium 4.0 3.5 - 5.2 mmol/L   Chloride 100 96 - 106 mmol/L   CO2 23 20 - 29 mmol/L   Calcium 9.6 8.7 - 10.2 mg/dL   Total Protein 7.0 6.0 - 8.5 g/dL   Albumin 4.4 3.5 - 5.5 g/dL   Globulin, Total 2.6 1.5 - 4.5 g/dL   Albumin/Globulin Ratio 1.7 1.2 - 2.2   Bilirubin Total 0.6 0.0 - 1.2 mg/dL   Alkaline Phosphatase 72 39 - 117 IU/L   AST 35 0 - 40 IU/L   ALT 37 0 - 44 IU/L  Lipid panel     Status: Abnormal   Collection Time: 02/06/17  8:23 AM  Result Value Ref Range   Cholesterol, Total 120 100 - 199 mg/dL   Triglycerides 782 0 - 149 mg/dL   HDL 34 (L) >95 mg/dL   VLDL Cholesterol Cal 29 5 - 40 mg/dL   LDL Calculated 57 0 - 99 mg/dL   Chol/HDL Ratio 3.5 0.0 - 5.0 ratio    Comment:                                   T. Chol/HDL Ratio  Men  Women                               1/2 Avg.Risk  3.4    3.3                                   Avg.Risk  5.0    4.4                                 2X Avg.Risk  9.6    7.1                                3X Avg.Risk 23.4   11.0   PSA     Status: None   Collection Time: 02/06/17  8:23 AM  Result Value Ref Range   Prostate Specific Ag, Serum 1.2 0.0 - 4.0 ng/mL    Comment: Roche ECLIA methodology. According to the American Urological Association, Serum PSA should decrease and remain at undetectable levels after radical prostatectomy. The AUA defines biochemical recurrence as an initial PSA value 0.2 ng/mL or greater followed by a subsequent confirmatory PSA value 0.2 ng/mL or greater. Values obtained with different assay methods or kits cannot be used interchangeably. Results cannot be interpreted as absolute evidence of the presence or absence of malignant disease.   HgB A1c     Status: Abnormal   Collection Time: 02/06/17  8:23 AM  Result Value Ref Range   Hgb A1c MFr Bld 6.3 (H) 4.8 - 5.6 %    Comment:          Prediabetes: 5.7 - 6.4          Diabetes: >6.4          Glycemic control for adults with diabetes: <7.0    Est. average glucose Bld gHb Est-mCnc 134 mg/dL   No results found.  VITALS: BP 102/80 (BP Location: Left Arm, Patient Position: Sitting, Cuff Size: Normal)   Pulse 71   Ht 5\' 6"  (1.676 m)   Wt 184 lb (83.5 kg)   BMI 29.70 kg/m   EXAM: General appearance: alert and no distress Neck: no carotid bruit, no JVD and thyroid not enlarged, symmetric, no tenderness/mass/nodules Lungs: clear to auscultation bilaterally Heart: regular rate and rhythm, S1, S2 normal, no murmur, click, rub or gallop Abdomen: soft, non-tender; bowel sounds normal; no masses,  no organomegaly Extremities: extremities normal, atraumatic, no cyanosis or edema Pulses: 2+ and symmetric Skin: Skin color, texture, turgor normal. No rashes or lesions Neurologic: Grossly normal Psych: Pleasant  EKG: Sinus rhythm at 71, RBBB-personally reviewed  ASSESSMENT: 1. Hypertension-controlled 2. Dyslipidemia 3. Right bundle branch  block  PLAN: 1.   Mr. Gadsden continues to feel well and is asymptomatic.  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  He has well-controlled hypertension.  His EKG is stable.  He is due for repeat lab work and we will go ahead and recheck a metabolic profile, CBC, lipid profile and PSA today.  He is encouraged to establish with a PCP again today.  He is also encouraged to do screening testing for established test for his age.  Follow-up annually or sooner as necessary.  Iantha Fallen  C. Rennis GoldenHilty, MD, Milagros LollFACC, FACP  [facility]  Medical Director of the [facility] Attending Cardiologist  Direct Dial: [phone]  Fax: [phone]  Website:  [email or web address]   Kenneth C Hilty 02/07/2017, 3:15 PM

## 2017-02-09 ENCOUNTER — Encounter: Payer: Self-pay | Admitting: Internal Medicine

## 2017-02-09 NOTE — Telephone Encounter (Signed)
REFILL 

## 2017-04-10 ENCOUNTER — Telehealth: Payer: Self-pay | Admitting: Internal Medicine

## 2017-04-10 NOTE — Telephone Encounter (Signed)
°*  STAT* If patient is at the pharmacy, call can be transferred to refill team   1. Which medications need to be refilled? (please list name of each medication and dose if known) Losartan 50mg   2. Which pharmacy/location (including street and city if local pharmacy) is medication to be sent to?[facility] [place]  3. Do they need a 30 day or 90 day supply? Requesting 90 days if possible

## 2017-04-16 ENCOUNTER — Other Ambulatory Visit: Payer: Self-pay | Admitting: Internal Medicine

## 2017-04-16 NOTE — Telephone Encounter (Signed)
REFILL 

## 2017-05-20 ENCOUNTER — Other Ambulatory Visit: Payer: Self-pay | Admitting: Internal Medicine

## 2017-05-20 NOTE — Telephone Encounter (Signed)
°*  STAT* If patient is at the pharmacy, call can be transferred to refill team.   1. Which medications need to be refilled? (please list name of each medication and dose if known) needs a new prescription,changing pharmacy- Atorvastatin and Hydrochlorothiazide  2. Which pharmacy/location (including street and city if local pharmacy) is medication to be sent to?[facility] [place]  3. Do they need a 30 day or 90 day supply? 90 and refills

## 2017-05-21 MED ORDER — ATORVASTATIN CALCIUM 80 MG PO TABS: ORAL_TABLET | ORAL | 2 refills | Status: DC

## 2017-05-21 MED ORDER — HYDROCHLOROTHIAZIDE 25 MG PO TABS: 25.0000 mg | ORAL_TABLET | Freq: Every day | ORAL | 2 refills | Status: DC

## 2018-02-14 ENCOUNTER — Other Ambulatory Visit: Payer: Self-pay | Admitting: Internal Medicine

## 2018-02-16 ENCOUNTER — Other Ambulatory Visit: Payer: Self-pay | Admitting: Internal Medicine

## 2018-03-16 ENCOUNTER — Telehealth: Payer: Self-pay | Admitting: Internal Medicine

## 2018-03-16 NOTE — Telephone Encounter (Signed)
New Message        Patient called and confirmed appt.

## 2018-03-16 NOTE — Telephone Encounter (Signed)
FYI

## 2018-03-19 ENCOUNTER — Encounter (INDEPENDENT_AMBULATORY_CARE_PROVIDER_SITE_OTHER): Payer: Self-pay

## 2018-03-19 ENCOUNTER — Ambulatory Visit (INDEPENDENT_AMBULATORY_CARE_PROVIDER_SITE_OTHER): Payer: Self-pay | Admitting: Internal Medicine

## 2018-03-19 ENCOUNTER — Telehealth: Payer: Self-pay | Admitting: Internal Medicine

## 2018-03-19 ENCOUNTER — Encounter: Payer: Self-pay | Admitting: Internal Medicine

## 2018-03-19 VITALS — BP 130/86 | HR 64 | Ht 66.0 in | Wt 185.0 lb

## 2018-03-19 DIAGNOSIS — I4510 Unspecified right bundle-branch block: Secondary | ICD-10-CM

## 2018-03-19 DIAGNOSIS — E785 Hyperlipidemia, unspecified: Secondary | ICD-10-CM

## 2018-03-19 DIAGNOSIS — Z1212 Encounter for screening for malignant neoplasm of rectum: Secondary | ICD-10-CM

## 2018-03-19 DIAGNOSIS — Z1211 Encounter for screening for malignant neoplasm of colon: Secondary | ICD-10-CM

## 2018-03-19 DIAGNOSIS — Z79899 Other long term (current) drug therapy: Secondary | ICD-10-CM

## 2018-03-19 DIAGNOSIS — Z131 Encounter for screening for diabetes mellitus: Secondary | ICD-10-CM

## 2018-03-19 DIAGNOSIS — I10 Essential (primary) hypertension: Secondary | ICD-10-CM

## 2018-03-19 DIAGNOSIS — Z125 Encounter for screening for malignant neoplasm of prostate: Secondary | ICD-10-CM

## 2018-03-19 MED ORDER — ATORVASTATIN CALCIUM 80 MG PO TABS: ORAL_TABLET | ORAL | 3 refills | Status: DC

## 2018-03-19 MED ORDER — LOSARTAN POTASSIUM 50 MG PO TABS: 50.0000 mg | ORAL_TABLET | Freq: Every day | ORAL | 3 refills | Status: DC

## 2018-03-19 MED ORDER — HYDROCHLOROTHIAZIDE 25 MG PO TABS: 25.0000 mg | ORAL_TABLET | Freq: Every day | ORAL | 3 refills | Status: DC

## 2018-03-19 NOTE — Telephone Encounter (Signed)
Faxed order for Cologuard to [employer] [facility] @ [phone]. Patient was provided with copy of order form while in office today so he can call and check on price if he chooses, as he is self-pay

## 2018-03-19 NOTE — Progress Notes (Signed)
OFFICE NOTE  Chief Complaint:  Annual follow-up  Primary Care Physician: Patient, No Pcp Per  HPI:  Douglas Mcguire is a 63 year old gentleman who I see annually for hypertension which has been well controlled, as well as dyslipidemia. 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 He had managed to lose some weight, however, has gained some of that back. His blood pressures remain stable. He does not do any specific exercise but is active. He denies any chest pain, shortness of breath, palpitations, presyncope, syncopal symptoms.   Douglas Mcguire returns today for followup. He reports feeling very well. Denies any chest pain or shortness of breath. He reports he started eating a healthier diet and he seemed a little bit of weight loss. He is taking atorvastatin 80 mg now and is due for repeat test of his cholesterol. Blood pressures been well controlled. He reports a history of fatty liver the past and we may need to survey his liver enzymes.  I had the pleasure of seeing Douglas Mcguire back in the office today for follow-up. Overall he is doing well denies any chest pain or shortness of breath. Occasionally he sees some black dots, which sound like maybe floaters. He has had good control of his blood pressure. She's on Lipitor 80 and had a particle number less than 1000 at his last office visit. He is due for repeat blood work.  01/25/2016  Douglas Mcguire returns today for follow-up. He has been asymptomatic over the past year. He denies chest pain or worsening shortness of breath. He is due for refills of medication. He does not have a PCP. He has not had a screening colonoscopy or any routine bloodwork. He has a stable RBBB which was new last year, but has been asymptomatic with this.  02/06/2017  Douglas Mcguire was seen today in follow-up.  This is his annual visit.  He continues to be active and is doing more physical lifting with  regards to furniture delivery.  He works for a company that is up and down the [employer].  He denies any chest pain or worsening shortness of breath with this activity.  He is still not settled with a PCP.  I had referred him for a screening colonoscopy but he I did not make that appointment.  He was asking about the possibility of doing Cologuard testing.  I said it would be best for him to establish with a PCP with regards to that as we do not carry the kids.  He is fasting today and interested in doing annual blood work.  EKG shows sinus rhythm with stable right bundle branch block.  03/19/2018  Douglas Mcguire is seen today in annual follow-up.  Last year was unfortunately eventful for him.  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  Heart wise he denies any chest pain or shortness of breath.  He is active, but does not exercise.  He has not had any lab work or seen a primary care provider.  He primarily gets medications through [place].  I again discussed possibility screening colonoscopy with him however he refused it and now is again interested in the Cologuard.  He will be due for repeat lab work and refills of his prescriptions.  PMHx:  Past Medical History:  Diagnosis Date  . Allergy   . Dyslipidemia   .  Dysrhythmia   . Epilepsy (HCC)   . GERD (gastroesophageal reflux disease)   . Hepatitis   . Hypertension     No past surgical history on file.  FAMHx:  Family History  Problem Relation Age of Onset  . Cancer Father     SOCHx:   reports that he quit smoking about 35 years ago. His smoking use included cigarettes. He has a 2.00 pack-year smoking history. He has never used smokeless tobacco. He reports that he does not drink alcohol or use drugs.  ALLERGIES:  No Known Allergies  ROS: Pertinent items noted in HPI and remainder of comprehensive ROS otherwise negative.  HOME MEDS: Current Outpatient Medications  Medication  Sig Dispense Refill  . ASHWAGANDHA PO Take by mouth daily.    Marland Kitchen. aspirin 81 MG tablet Take 81 mg by mouth daily.     Marland Kitchen. atorvastatin (LIPITOR) 80 MG tablet TAKE 1 TABLET BY MOUTH ONCE DAILY AT  6PM 30 tablet 0  . B Complex Vitamins (B COMPLEX PO) Take by mouth daily.    . calcium carbonate (CALCIUM 600) 600 MG TABS tablet Take 600 mg by mouth 2 (two) times daily with a meal.    . Chlorpheniramine Maleate (ALLERGY RELIEF PO) Take by mouth daily.    Marland Kitchen. FAMOTIDINE PO Take 1 tablet by mouth 2 (two) times daily.    . fish oil-omega-3 fatty acids 1000 MG capsule Take 1 g by mouth 2 (two) times daily.     . hydrochlorothiazide (HYDRODIURIL) 25 MG tablet TAKE 1 TABLET BY MOUTH ONCE DAILY 30 tablet 0  . L-THEANINE PO Take by mouth daily.    Marland Kitchen. losartan (COZAAR) 50 MG tablet TAKE ONE TABLET BY MOUTH ONCE DAILY 90 tablet 3  . MAGNESIUM PO Take by mouth daily.    . Multiple Vitamins-Minerals (MULTIVITAMIN WITH MINERALS) tablet Take 1 tablet by mouth daily.    . Multiple Vitamins-Minerals (VITAMIN D3 COMPLETE PO) Take 1 tablet by mouth daily.    . Potassium 99 MG TABS Take by mouth daily.    . ranitidine (ZANTAC) 150 MG capsule Take 150 mg by mouth 2 (two) times daily.    Marland Kitchen. VALERIAN ROOT PO Take by mouth daily.    . Zinc 50 MG CAPS Take 1 capsule by mouth daily.     No current facility-administered medications for this visit.     LABS/IMAGING: No results found for this or any previous visit (from the past 48 hour(s)). No results found.  VITALS: BP 130/86   Pulse 64   Ht 5\' 6"  (1.676 m)   Wt 185 lb (83.9 kg)   BMI 29.86 kg/m   EXAM: General appearance: alert and no distress Neck: no carotid bruit, no JVD and thyroid not enlarged, symmetric, no tenderness/mass/nodules Lungs: clear to auscultation bilaterally Heart: regular rate and rhythm, S1, S2 normal, no murmur, click, rub or gallop Abdomen: soft, non-tender; bowel sounds normal; no masses,  no organomegaly Extremities: extremities normal,  atraumatic, no cyanosis or edema Pulses: 2+ and symmetric Skin: Skin color, texture, turgor normal. No rashes or lesions Neurologic: Grossly normal Psych: Pleasant  EKG: Normal sinus rhythm 64, RBBB-personally reviewed  ASSESSMENT: 1. Hypertension-controlled 2. Dyslipidemia 3. Right bundle branch block  PLAN: 1.   Douglas Mcguire seems unchanged since I saw him last year.  He is asymptomatic.  Blood pressures well controlled.  He is due for repeat lipid profile.  We will adjust his medications accordingly and refill prescriptions for him.  Again I  recommended screening colonoscopy as well as offered vaccines including flu, pneumonia and shingles vaccines.  He declined at this time.  He would consider Cologuard screening and will go ahead and submit an order for that on his behalf. I again encouraged him to establish with a PCP.  Follow-up with me annually or sooner as necessary.  Chrystie NoseKenneth C. Akeya Ryther, MD, [facility] Sunflower Medical CenterFACC, FACP  [place]  [facility]  Medical Director of the [facility]  [facility] Attending Cardiologist  Direct Dial: [phone]  Fax: [phone]  Website:  [email or web address]   [place] C Holley Wirt 03/19/2018, 8:17 AM

## 2018-03-19 NOTE — Patient Instructions (Addendum)
Medication Instructions:  Continue current medications If you need a refill on your cardiac medications before your next appointment, please call your pharmacy.   Lab work: FASTING lab work to check cholesterol, PSA, A1c, CMET, CBC  Cologaurd screening has also been ordered. This will be sent to your house.   If you have labs (blood work) drawn today and your tests are completely normal, you will receive your results only by: Marland Kitchen MyChart Message (if you have MyChart) OR . A paper copy in the mail If you have any lab test that is abnormal or we need to change your treatment, we will call you to review the results.  Testing/Procedures: NONE  Follow-Up: At [facility], you and your health needs are our priority.  As part of our continuing mission to provide you with exceptional heart care, we have created designated Provider Care Teams.  These Care Teams include your primary Cardiologist (physician) and Advanced Practice Providers (APPs -  Physician Assistants and Nurse Practitioners) who all work together to provide you with the care you need, when you need it. You will need a follow up appointment in 12 months.  Please call our office 2 months in advance to schedule this appointment.  You may see Dr. Rennis Golden or one of the following Advanced Practice Providers on your designated Care Team: Azalee Course, [place] . Micah Flesher, PA-C  Any Other Special Instructions Will Be Listed Below (If Applicable).

## 2018-03-20 LAB — COMPREHENSIVE METABOLIC PANEL
ALT: 58 IU/L — ABNORMAL HIGH (ref 0–44)
AST: 49 IU/L — ABNORMAL HIGH (ref 0–40)
Albumin/Globulin Ratio: 1.7 (ref 1.2–2.2)
Albumin: 4.5 g/dL (ref 3.8–4.9)
Alkaline Phosphatase: 80 IU/L (ref 39–117)
BUN/Creatinine Ratio: 16 (ref 10–24)
BUN: 15 mg/dL (ref 8–27)
Bilirubin Total: 0.7 mg/dL (ref 0.0–1.2)
CHLORIDE: 99 mmol/L (ref 96–106)
CO2: 23 mmol/L (ref 20–29)
Calcium: 9.6 mg/dL (ref 8.6–10.2)
Creatinine, Ser: 0.91 mg/dL (ref 0.76–1.27)
GFR calc Af Amer: 106 mL/min/{1.73_m2} (ref >59)
GFR calc non Af Amer: 91 mL/min/{1.73_m2} (ref >59)
Globulin, Total: 2.6 g/dL (ref 1.5–4.5)
Glucose: 104 mg/dL — ABNORMAL HIGH (ref 65–99)
Potassium: 4.2 mmol/L (ref 3.5–5.2)
Sodium: 140 mmol/L (ref 134–144)
Total Protein: 7.1 g/dL (ref 6.0–8.5)

## 2018-03-20 LAB — HEMOGLOBIN A1C
Est. average glucose Bld gHb Est-mCnc: 123 mg/dL
Hgb A1c MFr Bld: 5.9 % — ABNORMAL HIGH (ref 4.8–5.6)

## 2018-03-20 LAB — CBC
Hematocrit: 43.8 % (ref 37.5–51.0)
Hemoglobin: 15.3 g/dL (ref 13.0–17.7)
MCH: 32 pg (ref 26.6–33.0)
MCHC: 34.9 g/dL (ref 31.5–35.7)
MCV: 92 fL (ref 79–97)
Platelets: 175 10*3/uL (ref 150–450)
RBC: 4.78 x10E6/uL (ref 4.14–5.80)
RDW: 12.7 % (ref 11.6–15.4)
WBC: 7.3 10*3/uL (ref 3.4–10.8)

## 2018-03-20 LAB — LIPID PANEL
CHOL/HDL RATIO: 4 ratio (ref 0.0–5.0)
Cholesterol, Total: 127 mg/dL (ref 100–199)
HDL: 32 mg/dL — ABNORMAL LOW (ref >39)
LDL Calculated: 56 mg/dL (ref 0–99)
Triglycerides: 195 mg/dL — ABNORMAL HIGH (ref 0–149)
VLDL Cholesterol Cal: 39 mg/dL (ref 5–40)

## 2018-03-20 LAB — PSA: Prostate Specific Ag, Serum: 0.9 ng/mL (ref 0.0–4.0)

## 2018-03-23 ENCOUNTER — Encounter: Payer: Self-pay | Admitting: Internal Medicine

## 2018-09-18 ENCOUNTER — Emergency Department (HOSPITAL_BASED_OUTPATIENT_CLINIC_OR_DEPARTMENT_OTHER)
Admission: EM | Admit: 2018-09-18 | Discharge: 2018-09-18 | Disposition: A | Discharge status: Home / Self Care | Payer: Self-pay | Attending: Emergency Medicine | Admitting: Emergency Medicine

## 2018-09-18 ENCOUNTER — Emergency Department (HOSPITAL_BASED_OUTPATIENT_CLINIC_OR_DEPARTMENT_OTHER): Payer: Self-pay

## 2018-09-18 ENCOUNTER — Other Ambulatory Visit: Payer: Self-pay

## 2018-09-18 DIAGNOSIS — N5089 Other specified disorders of the male genital organs: Secondary | ICD-10-CM

## 2018-09-18 DIAGNOSIS — Z87891 Personal history of nicotine dependence: Secondary | ICD-10-CM | POA: Insufficient documentation

## 2018-09-18 DIAGNOSIS — I10 Essential (primary) hypertension: Secondary | ICD-10-CM | POA: Insufficient documentation

## 2018-09-18 DIAGNOSIS — N451 Epididymitis: Secondary | ICD-10-CM | POA: Insufficient documentation

## 2018-09-18 DIAGNOSIS — Z79899 Other long term (current) drug therapy: Secondary | ICD-10-CM | POA: Insufficient documentation

## 2018-09-18 DIAGNOSIS — R1031 Right lower quadrant pain: Secondary | ICD-10-CM | POA: Insufficient documentation

## 2018-09-18 LAB — URINALYSIS, ROUTINE W REFLEX MICROSCOPIC
Bilirubin Urine: NEGATIVE
Glucose, UA: NEGATIVE mg/dL
Ketones, ur: NEGATIVE mg/dL
Nitrite: POSITIVE — AB
Protein, ur: NEGATIVE mg/dL
Specific Gravity, Urine: <1.005 — ABNORMAL LOW (ref 1.005–1.030)
pH: 7.5 (ref 5.0–8.0)

## 2018-09-18 LAB — COMPREHENSIVE METABOLIC PANEL
ALT: 43 U/L (ref 0–44)
AST: 33 U/L (ref 15–41)
Albumin: 3.9 g/dL (ref 3.5–5.0)
Alkaline Phosphatase: 103 U/L (ref 38–126)
Anion gap: 15 (ref 5–15)
BUN: 16 mg/dL (ref 8–23)
CO2: 25 mmol/L (ref 22–32)
Calcium: 9.2 mg/dL (ref 8.9–10.3)
Chloride: 95 mmol/L — ABNORMAL LOW (ref 98–111)
Creatinine, Ser: 1 mg/dL (ref 0.61–1.24)
GFR calc Af Amer: >60 mL/min (ref >60)
GFR calc non Af Amer: >60 mL/min (ref >60)
Glucose, Bld: 96 mg/dL (ref 70–99)
Potassium: 4.3 mmol/L (ref 3.5–5.1)
Sodium: 135 mmol/L (ref 135–145)
Total Bilirubin: 1.2 mg/dL (ref 0.3–1.2)
Total Protein: 8.3 g/dL — ABNORMAL HIGH (ref 6.5–8.1)

## 2018-09-18 LAB — CBC
HCT: 41.4 % (ref 39.0–52.0)
Hemoglobin: 13.4 g/dL (ref 13.0–17.0)
MCH: 28.5 pg (ref 26.0–34.0)
MCHC: 32.4 g/dL (ref 30.0–36.0)
MCV: 88.1 fL (ref 80.0–100.0)
Platelets: 256 10*3/uL (ref 150–400)
RBC: 4.7 MIL/uL (ref 4.22–5.81)
RDW: 13.8 % (ref 11.5–15.5)
WBC: 18.2 10*3/uL — ABNORMAL HIGH (ref 4.0–10.5)
nRBC: 0 % (ref 0.0–0.2)

## 2018-09-18 LAB — URINALYSIS, MICROSCOPIC (REFLEX): RBC / HPF: >50 RBC/hpf (ref 0–5)

## 2018-09-18 MED ORDER — HYDROMORPHONE HCL 1 MG/ML IJ SOLN
1.0000 mg | Freq: Once | INTRAMUSCULAR | Status: AC
  Administered 2018-09-18: 1 mg via INTRAVENOUS
  Filled 2018-09-18: qty 1

## 2018-09-18 MED ORDER — HYDROCODONE-ACETAMINOPHEN 5-325 MG PO TABS: 1.0000 | ORAL_TABLET | Freq: Four times a day (QID) | ORAL | 0 refills | Status: DC | PRN

## 2018-09-18 MED ORDER — LEVOFLOXACIN 500 MG PO TABS: 500.0000 mg | ORAL_TABLET | Freq: Every day | ORAL | 0 refills | Status: AC

## 2018-09-18 MED ORDER — ONDANSETRON HCL 4 MG/2ML IJ SOLN
4.0000 mg | Freq: Once | INTRAMUSCULAR | Status: AC
  Administered 2018-09-18: 4 mg via INTRAVENOUS
  Filled 2018-09-18: qty 2

## 2018-09-18 MED ORDER — IOHEXOL 300 MG/ML  SOLN
100.0000 mL | Freq: Once | INTRAMUSCULAR | Status: AC | PRN
  Administered 2018-09-18: 100 mL via INTRAVENOUS

## 2018-09-18 MED ORDER — SODIUM CHLORIDE 0.9 % IV SOLN
1.0000 g | Freq: Once | INTRAVENOUS | Status: AC
  Administered 2018-09-18: 1 g via INTRAVENOUS
  Filled 2018-09-18: qty 10

## 2018-09-18 MED ORDER — MORPHINE SULFATE (PF) 4 MG/ML IV SOLN
4.0000 mg | Freq: Once | INTRAVENOUS | Status: AC
  Administered 2018-09-18: 4 mg via INTRAVENOUS
  Filled 2018-09-18: qty 1

## 2018-09-18 MED ORDER — SODIUM CHLORIDE 0.9 % IV BOLUS
1000.0000 mL | Freq: Once | INTRAVENOUS | Status: AC
  Administered 2018-09-18: 1000 mL via INTRAVENOUS

## 2018-09-18 MED ORDER — LEVOFLOXACIN 500 MG PO TABS: 500.0000 mg | ORAL_TABLET | Freq: Every day | ORAL | 0 refills | Status: DC

## 2018-09-18 NOTE — ED Notes (Signed)
CMP results required prior to imaging per radiology protocol.

## 2018-09-18 NOTE — ED Notes (Signed)
IV attempted x2 without success.

## 2018-09-18 NOTE — ED Notes (Signed)
Patient transported to Ultrasound 

## 2018-09-18 NOTE — ED Notes (Signed)
ED Provider at bedside. 

## 2018-09-18 NOTE — ED Triage Notes (Signed)
Pt here with right flank pain radiating to testicle and abnormal urinalysis from [facility].

## 2018-09-18 NOTE — ED Notes (Signed)
Unsuccessful IV stick x 2. Another RN to attempt

## 2018-09-18 NOTE — ED Provider Notes (Signed)
[facility] EMERGENCY DEPARTMENT Provider Note   CSN: [id number] Arrival date & time: 09/18/18  1146  History   Chief Complaint Chief Complaint  Patient presents with   Flank Pain    HPI Douglas Mcguire is a 63 y.o. male with past medical history significant for GERD, hypertension, epilepsy, hyperlipidemia who presents for evaluation of flank pain and scrotal swelling.  Patient states he has been felt generally unwell over the last week.  [place] a truck [place]  Patient states he had a COVID test which was negative approximately 1 week ago.  Patient states he has had right flank pain  At that time however non currently.  Patient states pain towards his right testicle.  Patient states over the last 24 hours he has noticed testicular swelling, redness.  States the area is very tender to palpation.  He is also noted hematuria and dysuria.  Prior history of facial abscess however no other abscesses.  Patient describes his pain as aching and throbbing.  Was seen by urgent care today and was sent to the ED for evaluation.  Denies fever however has had chills last week.  Denies headache, neck pain, neck stiffness, chest pain, shortness of breath, abdominal pain, diarrhea or constipation.  Patient denies melena or hematochezia.  Denies prior history of nephrolithiasis or pyelonephritis.  Rates his current pain a 7/10.  Has not taken anything for his pain PTA.  Denies additional aggravating or alleviating factors. Denies concerns for STDs. No penile dc or swelling.  History obtained from patient and past medical records.  No interpreter is used.   Last PO-- yesterday 7 pm      HPI  Past Medical History:  Diagnosis Date   Allergy    Dyslipidemia    Dysrhythmia    Epilepsy (HCC)    GERD (gastroesophageal reflux disease)    Hepatitis    Hypertension     Patient Active Problem List   Diagnosis Date Noted   Encounter for screening  colonoscopy 01/25/2016   RBBB 12/22/2014   Essential hypertension 03/29/2011   Hyperlipidemia 03/29/2011   GERD (gastroesophageal reflux disease) 03/29/2011    No past surgical history on file.      Home Medications    Prior to Admission medications   Medication Sig Start Date End Date Taking? Authorizing Provider  ASHWAGANDHA PO Take by mouth daily.    [provider]  aspirin 81 MG tablet Take 81 mg by mouth daily.     [provider]  atorvastatin (LIPITOR) 80 MG tablet TAKE 1 TABLET BY MOUTH ONCE DAILY AT  [facility]6PM 03/19/18   Hilty, Lisette AbuKenneth C, MD  B Complex Vitamins (B COMPLEX PO) Take by mouth daily.    [provider]  calcium carbonate (CALCIUM 600) 600 MG TABS tablet Take 600 mg by mouth 2 (two) times daily with a meal.    [provider]  Chlorpheniramine Maleate (ALLERGY RELIEF PO) Take by mouth daily.    [provider]  FAMOTIDINE PO Take 1 tablet by mouth 2 (two) times daily.    [provider]  fish oil-omega-3 fatty acids 1000 MG capsule Take 1 g by mouth 2 (two) times daily.     [provider]  hydrochlorothiazide (HYDRODIURIL) 25 MG tablet Take 1 tablet (25 mg total) by mouth daily. 03/19/18   Hilty, Lisette AbuKenneth C, MD  HYDROcodone-acetaminophen (NORCO/VICODIN) 5-325 MG tablet Take 1 tablet by mouth every 6 (six) hours as needed  for severe pain. 09/18/18   Miarose Lippert A, PA-C  L-THEANINE PO Take by mouth daily.    [provider]  levofloxacin (LEVAQUIN) 500 MG tablet Take 1 tablet (500 mg total) by mouth daily for 10 days. 09/18/18 09/28/18  Octavian Godek A, PA-C  losartan (COZAAR) 50 MG tablet Take 1 tablet (50 mg total) by mouth daily. 03/19/18   Hilty, Nadean Corwin, MD  MAGNESIUM PO Take by mouth daily.    [provider]  Multiple Vitamins-Minerals (MULTIVITAMIN WITH MINERALS) tablet Take 1 tablet by mouth daily.    [provider]  Multiple Vitamins-Minerals (VITAMIN D3 COMPLETE  PO) Take 1 tablet by mouth daily.    [provider]  Potassium 99 MG TABS Take by mouth daily.    [provider]  ranitidine (ZANTAC) 150 MG capsule Take 150 mg by mouth 2 (two) times daily.    [provider]  VALERIAN ROOT PO Take by mouth daily.    [provider]  Zinc 50 MG CAPS Take 1 capsule by mouth daily.    [provider]    Family History Family History  Problem Relation Age of Onset   Cancer Father     Social History Social History   Tobacco Use   Smoking status: Former Smoker    Packs/day: 1.00    Years: 2.00    Pack years: 2.00    Types: Cigarettes    Quit date: 11/13/1982    Years since quitting: 35.8   Smokeless tobacco: Never Used  Substance Use Topics   Alcohol use: No   Drug use: No     Allergies   Patient has no known allergies.   Review of Systems Review of Systems  Constitutional: Positive for chills. Negative for activity change, appetite change, diaphoresis, fatigue, fever and unexpected weight change.  HENT: Negative.   Eyes: Negative.   Respiratory: Negative.   Cardiovascular: Negative.   Gastrointestinal: Negative.   Genitourinary: Positive for dysuria, flank pain (last week), hematuria, scrotal swelling and testicular pain. Negative for decreased urine volume, discharge, frequency, genital sores, penile pain, penile swelling and urgency.  Neurological: Negative.   All other systems reviewed and are negative.    Physical Exam Updated Vital Signs BP 109/72 (BP Location: Right Arm)    Pulse 77    Temp 98.6 F (37 C) (Oral)    Resp 16    Ht 5\' 6"  (1.676 m)    Wt 78.9 kg    SpO2 98%    BMI 28.08 kg/m   Physical Exam Vitals signs and nursing note reviewed. Exam conducted with a chaperone present.  Constitutional:      General: He is not in acute distress.    Appearance: He is well-developed. He is not ill-appearing, toxic-appearing or diaphoretic.  HENT:     Head: Normocephalic and  atraumatic.     Nose: Nose normal.     Mouth/Throat:     Mouth: Mucous membranes are moist.     Pharynx: Oropharynx is clear.  Eyes:     Pupils: Pupils are equal, round, and reactive to light.  Neck:     Musculoskeletal: Normal range of motion and neck supple.  Cardiovascular:     Rate and Rhythm: Normal rate and regular rhythm.     Pulses: Normal pulses.     Heart sounds: Normal heart sounds. No murmur. No friction rub. No gallop.   Pulmonary:     Effort: Pulmonary effort is normal. No respiratory  distress.     Breath sounds: Normal breath sounds. No stridor. No wheezing, rhonchi or rales.  Abdominal:     General: Bowel sounds are normal. There is no distension.     Palpations: Abdomen is soft.     Tenderness: There is abdominal tenderness in the right lower quadrant and suprapubic area.     Comments: No tenderness to bilateral flank. Abdomen soft without rebound or guarding.  Mild tenderness to right lower quadrant.  No evidence of incarcerated or strangulated hernia.  Genitourinary:    Penis: Normal.      Scrotum/Testes:        Right: Tenderness and swelling present.       Comments: Erythema, swelling to right testes.  Tender to palpation. No induration or fluctuance. No penile discharge or swelling. Musculoskeletal: Normal range of motion.  Skin:    General: Skin is warm and dry.  Neurological:     Mental Status: He is alert.     ED Treatments / Results  Labs (all labs ordered are listed, but only abnormal results are displayed) Labs Reviewed  URINALYSIS, ROUTINE W REFLEX MICROSCOPIC - Abnormal; Notable for the following components:      Result Value   APPearance CLOUDY (*)    Specific Gravity, Urine <1.005 (*)    Hgb urine dipstick LARGE (*)    Nitrite POSITIVE (*)    Leukocytes,Ua MODERATE (*)    All other components within normal limits  CBC - Abnormal; Notable for the following components:   WBC 18.2 (*)    All other components within normal limits    COMPREHENSIVE METABOLIC PANEL - Abnormal; Notable for the following components:   Chloride 95 (*)    Total Protein 8.3 (*)    All other components within normal limits  URINALYSIS, MICROSCOPIC (REFLEX) - Abnormal; Notable for the following components:   Bacteria, UA MANY (*)    All other components within normal limits  URINE CULTURE    EKG None  Radiology Ct Abdomen Pelvis W Contrast  Result Date: 09/18/2018 CLINICAL DATA:  Right flank pain. Scrotal swelling and redness and pain. Fever. EXAM: CT ABDOMEN AND PELVIS WITH CONTRAST TECHNIQUE: Multidetector CT imaging of the abdomen and pelvis was performed using the standard protocol following bolus administration of intravenous contrast. CONTRAST:  100mL OMNIPAQUE IOHEXOL 300 MG/ML  SOLN COMPARISON:  None. FINDINGS: Lower chest: There is a tiny area of parenchymal scarring at the right base laterally. Otherwise normal. Hepatobiliary: Diffuse mild hepatic steatosis. Liver parenchyma is otherwise normal. Biliary tree is normal. Pancreas: Unremarkable. No pancreatic ductal dilatation or surrounding inflammatory changes. Spleen: Normal in size without focal abnormality. Adrenals/Urinary Tract: The adrenal glands and kidneys appear normal. There is no hydronephrosis. However, there is a vague area of increased density in the distal right ureter seen on images 64 through 66 of series 2. The bladder is normal. Stomach/Bowel: Stomach is within normal limits. Appendix is not identified. No evidence of bowel wall thickening, distention, or inflammatory changes. Vascular/Lymphatic: No significant vascular findings are present. No enlarged abdominal or pelvic lymph nodes. Reproductive: There are prominent vessels in the right side of the scrotum consistent with a varicocele. The right epididymis also appears prominent and enhances as compared to the opposite side. Prostate is unremarkable. Other: No abdominal wall hernia or abnormality. No abdominopelvic  ascites. Musculoskeletal: No acute abnormality. 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 IMPRESSION: 1. Prominent vessels in the right  side of the scrotum consistent with a varicocele. The right epididymis is prominent and enhances as compared to the opposite side suggestive of epididymitis. 2. Subtle increased density in the distal right ureter could represent tiny stones but there is no dilatation of the proximal right ureter. Does the patient have hematuria? Electronically Signed   By: Francene BoyersJames  Maxwell M.D.   On: 09/18/2018 15:32   [place] Renal  Result Date: 09/18/2018 CLINICAL DATA:  RIGHT flank pain radiating to RIGHT groin since yesterday EXAM: RENAL / URINARY TRACT ULTRASOUND COMPLETE COMPARISON:  CT abdomen and pelvis 09/18/2018 FINDINGS: Right Kidney: Renal measurements: 10.2 x 5.1 x 5.1 cm = volume: 139 mL . Normal cortical thickness and echogenicity. No mass, hydronephrosis or shadowing calcification. Left Kidney: Renal measurements: 10.8 x 5.2 x 5.1 cm = volume: 150 mL. Normal cortical thickness and echogenicity. No mass, hydronephrosis or shadowing calcification. Bladder: Appears normal for degree of bladder distention. IMPRESSION: Normal exam. Electronically Signed   By: Ulyses SouthwardMark  Boles M.D.   On: 09/18/2018 17:49   [place] Scrotum W/doppler  Result Date: 09/18/2018 CLINICAL DATA:  Right testicle pain and swelling today. EXAM: SCROTAL ULTRASOUND DOPPLER ULTRASOUND OF THE TESTICLES TECHNIQUE: Complete ultrasound examination of the testicles, epididymis, and other scrotal structures was performed. Color and spectral Doppler ultrasound were also utilized to evaluate blood flow to the testicles. COMPARISON:  CT scan dated 09/18/2018 FINDINGS: Right testicle Measurements: 3.9 x 3.0 x 3.1 cm. No mass or microlithiasis visualized. Left testicle Measurements: 3.9 x 2.4 x 2.7 cm. No solid mass or microlithiasis visualized. 3 mm testicular cyst  of no significance. Right epididymis: The right epididymis is enlarged and hypervascular consistent with epididymitis. Left epididymis:  Normal in size and appearance. Hydrocele:  Small bilateral hydroceles. Varicocele: No definitive varicocele. However, prominent vessels are noted in the right side of the scrotum on the CT scan of the same date. Pulsed Doppler interrogation of both testes demonstrates normal low resistance arterial and venous waveforms bilaterally. IMPRESSION: Right epididymitis. Electronically Signed   By: Francene BoyersJames  Maxwell M.D.   On: 09/18/2018 17:59    Procedures Procedures (including critical care time)  Medications Ordered in ED Medications  sodium chloride 0.9 % bolus 1,000 mL (0 mLs Intravenous Stopped 09/18/18 1606)  ondansetron (ZOFRAN) injection 4 mg (4 mg Intravenous Given 09/18/18 1435)  morphine 4 MG/ML injection 4 mg (4 mg Intravenous Given 09/18/18 1435)  iohexol (OMNIPAQUE) 300 MG/ML solution 100 mL (100 mLs Intravenous Contrast Given 09/18/18 1451)  cefTRIAXone (ROCEPHIN) 1 g in sodium chloride 0.9 % 100 mL IVPB ( Intravenous Stopped 09/18/18 1824)  HYDROmorphone (DILAUDID) injection 1 mg (1 mg Intravenous Given 09/18/18 1739)     Initial Impression / Assessment and Plan / ED Course  I have reviewed the triage vital signs and the nursing notes.  Pertinent labs & imaging results that were available during my care of the patient were reviewed by me and considered in my medical decision making (see chart for details).  63 year old male appears otherwise well presents for evaluation of positive urinalysis at urgent care with scrotal swelling.  He is afebrile, nonseptic, non-ill-appearing.  He is tolerating p.o. intake at home.  Last week patient had chills and right-sided flank pain however has not had any over 1 week.  He has diffuse tenderness with redness and swelling to his right scrotum.  Significant tenderness to his epididymis on the right.  No evidence of  induration, fluctuance or evidence of cellulitis on exam.  Adamant soft, nontender  without rebound or guarding.  He has negative CVA type bilaterally.  He is without tachycardia, tachypnea or hypoxia in emergency department.  Concern for epididymitis versus infected stone given flank pain 1 week ago.  Obtain imaging, labs, fluids and reevaluate.  Labs and imaging personally reviewed: CBC with leukocytosis at 18.2 Metabolic panel without electrolyte, renal or liver abnormality Urinalysis grossly positive for infection.  Will culture. CTAP with varicocele and right side epididymitis.  There is possible tiny distal ureter stones however no hydronephrosis or dilation to suggest stone.  Discussed patient with Dr. Silverio Lay, attending physician who recommends ultrasound renal, scrotal and reevaluate.  Ultrasound renal without evidence of hydronephrosis or obvious stones Ultrasound scrotum with epididymitis.  No evidence of torsion or abscess.  1800: Pain controlled in ED. Tolerating PO intake without difficulty. Will consult with urology for antibiotic therapy and possible inpatient versus outpatient management for possible epididymitis and possibly infected stone.  1830: Consulted with Dr. Annabell Howells with Urology who states DC home with Levaquin and strict return precautions.  Patient to follow-up with urology on Monday or to return to emergency department at [facility] if develops fever, flank pain, worsening symptoms.  1850: Patient ambulating and urinating without difficulty in emergency department.  Has been given IV antibiotics.  Discussed with Dr. Annabell Howells who recommends ABX and can dc home with close follow up and return if worsening sytmpoms.  Up-to-date recommends Levaquin in patient's age category.  Patient does not meet sirs or sepsis criteria.  Discussed strict return precautions with patient.  Patient voiced understanding and is agreeable for follow-up.  Patient to follow-up with urology on  Monday.  The patient has been appropriately medically screened and/or stabilized in the ED. I have low suspicion for any other emergent medical condition which would require further screening, evaluation or treatment in the ED or require inpatient management.  Patient is hemodynamically stable and in no acute distress.  Patient able to ambulate in department prior to ED.  Evaluation does not show acute pathology that would require ongoing or additional emergent interventions while in the emergency department or further inpatient treatment.  I have discussed the diagnosis with the patient and answered all questions.  Pain is been managed while in the emergency department and patient has no further complaints prior to discharge.  Patient is comfortable with plan discussed in room and is stable for discharge at this time.  I have discussed strict return precautions for returning to the emergency department.  Patient was encouraged to follow-up with PCP/specialist refer to at discharge.     Final Clinical Impressions(s) / ED Diagnoses   Final diagnoses:  Epididymitis    ED Discharge Orders         Ordered    levofloxacin (LEVAQUIN) 500 MG tablet  Daily,   Status:  Discontinued     09/18/18 1841    HYDROcodone-acetaminophen (NORCO/VICODIN) 5-325 MG tablet  Every 6 hours PRN,   Status:  Discontinued     09/18/18 1841    HYDROcodone-acetaminophen (NORCO/VICODIN) 5-325 MG tablet  Every 6 hours PRN     09/18/18 1850    levofloxacin (LEVAQUIN) 500 MG tablet  Daily     09/18/18 1850           Danyle Boening A, PA-C 09/18/18 Juanetta Snow, MD 09/19/18 [phone]

## 2018-09-18 NOTE — ED Notes (Signed)
Pt ambulatory to BR with steady gate- pt reports pain to R groin with ambulation and movement.

## 2018-09-18 NOTE — Discharge Instructions (Addendum)
Take the antibiotics as prescribed.  If you develop worsening flank pain, throwing up, fever you need to present to the emergency department at [facility].  You need to follow-up with urology on Monday.  Please call their office.  Their contact information is listed in your discharge paperwork.

## 2018-09-21 LAB — URINE CULTURE: Culture: >=100000 — AB

## 2018-09-22 ENCOUNTER — Telehealth: Payer: Self-pay | Admitting: Emergency Medicine

## 2018-09-22 NOTE — Telephone Encounter (Signed)
Post ED Visit - Positive Culture Follow-up  Culture report reviewed by antimicrobial stewardship pharmacist: [place] Team []  Elenor Quinones, Pharm.D. []  Heide Guile, Pharm.D., BCPS AQ-ID []  Parks Neptune, Pharm.D., BCPS []  Alycia Rossetti, Pharm.D., BCPS []  [place], Pharm.D., BCPS, AAHIVP []  Legrand Como, Pharm.D., BCPS, AAHIVP []  Salome Arnt, PharmD, BCPS []  Johnnette Gourd, PharmD, BCPS []  Hughes Better, PharmD, BCPS []  Leeroy Cha, PharmD []  Laqueta Linden, PharmD, BCPS []  Albertina Parr, PharmD Elicia Lamp PharmD  [place] Team []  Leodis Sias, PharmD []  Lindell Spar, PharmD []  Royetta Asal, PharmD []  Graylin Shiver, Rph []  Rema Fendt) Glennon Mac, PharmD []  Arlyn Dunning, PharmD []  Netta Cedars, PharmD []  Dia Sitter, PharmD []  Leone Haven, PharmD []  Gretta Arab, PharmD []  Theodis Shove, PharmD []  Peggyann Juba, PharmD []  Reuel Boom, PharmD   Positive urine culture Treated with levofloxacin, organism sensitive to the same and no further patient follow-up is required at this time.  Hazle Nordmann 09/22/2018, 1:29 PM

## 2019-02-09 ENCOUNTER — Other Ambulatory Visit: Payer: Self-pay

## 2019-02-09 ENCOUNTER — Ambulatory Visit: Payer: HRSA Program | Attending: Internal Medicine

## 2019-02-09 DIAGNOSIS — Z20828 Contact with and (suspected) exposure to other viral communicable diseases: Secondary | ICD-10-CM | POA: Diagnosis present

## 2019-02-09 DIAGNOSIS — Z20822 Contact with and (suspected) exposure to covid-19: Secondary | ICD-10-CM

## 2019-02-10 NOTE — Progress Notes (Signed)
Order(s) created erroneously. Erroneous order ID: [id number]  Order moved by: Brigitte Pulse  Order move date/time: 02/10/2019 6:08 PM  Source Patient: [id number]  Source Contact: 02/09/2019  Destination Patient: [id number]  Destination Contact: 02/09/2019

## 2019-02-10 NOTE — Progress Notes (Signed)
Moving orders to this encounter.  

## 2019-02-10 NOTE — Progress Notes (Signed)
Order(s) created erroneously. Erroneous order ID: [id number]  Order moved by: Meridian Scherger M  Order move date/time: 02/10/2019 6:08 PM  Source Patient: [id number]  Source Contact: 02/09/2019  Destination Patient: [id number]  Destination Contact: 02/09/2019 

## 2019-02-11 LAB — NOVEL CORONAVIRUS, NAA: SARS-CoV-2, NAA: NOT DETECTED

## 2019-03-24 ENCOUNTER — Telehealth (INDEPENDENT_AMBULATORY_CARE_PROVIDER_SITE_OTHER): Payer: Self-pay | Admitting: Internal Medicine

## 2019-03-24 ENCOUNTER — Telehealth: Payer: Self-pay | Admitting: Internal Medicine

## 2019-03-24 ENCOUNTER — Encounter: Payer: Self-pay | Admitting: Internal Medicine

## 2019-03-24 VITALS — BP 120/75 | Wt 180.0 lb

## 2019-03-24 DIAGNOSIS — Z131 Encounter for screening for diabetes mellitus: Secondary | ICD-10-CM

## 2019-03-24 DIAGNOSIS — I4510 Unspecified right bundle-branch block: Secondary | ICD-10-CM

## 2019-03-24 DIAGNOSIS — I10 Essential (primary) hypertension: Secondary | ICD-10-CM

## 2019-03-24 DIAGNOSIS — Z125 Encounter for screening for malignant neoplasm of prostate: Secondary | ICD-10-CM

## 2019-03-24 DIAGNOSIS — Z79899 Other long term (current) drug therapy: Secondary | ICD-10-CM

## 2019-03-24 DIAGNOSIS — E785 Hyperlipidemia, unspecified: Secondary | ICD-10-CM

## 2019-03-24 NOTE — Progress Notes (Signed)
Virtual Visit via Video Note   This visit type was conducted due to national recommendations for restrictions regarding the COVID-19 Pandemic (e.g. social distancing) in an effort to limit this patient's exposure and mitigate transmission in our community.  Due to his co-morbid illnesses, this patient is at least at moderate risk for complications without adequate follow up.  This format is felt to be most appropriate for this patient at this time.  All issues noted in this document were discussed and addressed.  A limited physical exam was performed with this format.  Please refer to the patient's chart for his consent to telehealth for [facility].   Evaluation Performed:  Doxy.me video visit  Date:  03/24/2019   ID:  Douglas Douglas Mcguire, DOB [birth year], MRN [id number]  Patient Location:  [address] [place] [address]  Provider location:   [address], [address] [place], [place] [address]  PCP:  Patient, No Pcp Per  Cardiologist:  No primary care provider on file. Electrophysiologist:  None   Chief Complaint:  Feels well  History of Present Illness:    Douglas Douglas Mcguire is a 64 y.o. male who presents via audio/video conferencing for a telehealth visit today.  Douglas Douglas Mcguire was seen today via virtual visit.  I followed him for a number of years for hypertension, dyslipidemia and a right bundle branch block.  He has no cardiac symptoms.  Fortunately has been able to be employed over the past year.  He currently works for [employer].  He denies any chest pain or shortness of breath.  His blood pressure is well controlled.  He has not had any repeat lab work in the last year and does not have a primary care provider.  The patient does not have symptoms concerning for COVID-19 infection (fever, chills, cough, or new SHORTNESS OF BREATH).    Prior CV studies:   The following studies were reviewed today:  Chart reviewed  PMHx:   Past Medical History:  Diagnosis Date  . Allergy   . Dyslipidemia   . Dysrhythmia   . Epilepsy (HCC)   . GERD (gastroesophageal reflux disease)   . Hepatitis   . Hypertension     No past surgical history on file.  FAMHx:  Family History  Problem Relation Age of Onset  . Cancer Father     SOCHx:   reports that he quit smoking about 36 years ago. His smoking use included cigarettes. He has a 2.00 pack-year smoking history. He has never used smokeless tobacco. He reports that he does not drink alcohol or use drugs.  ALLERGIES:  No Known Allergies  MEDS:  Current Meds  Medication Sig  . ASHWAGANDHA PO Take by mouth daily.  Marland Kitchen aspirin 81 MG tablet Take 81 mg by mouth daily.   Marland Kitchen atorvastatin (LIPITOR) 80 MG tablet TAKE 1 TABLET BY MOUTH ONCE DAILY AT  6PM  . B Complex Vitamins (B COMPLEX PO) Take by mouth daily.  . calcium carbonate (CALCIUM 600) 600 MG TABS tablet Take 600 mg by mouth 2 (two) times daily with a meal.  . Chlorpheniramine Maleate (ALLERGY RELIEF PO) Take by mouth daily.  Marland Kitchen FAMOTIDINE PO Take 1 tablet by mouth at bedtime.   . fish oil-omega-3 fatty acids 1000 MG capsule Take 1 g by mouth 2 (two) times daily.   . hydrochlorothiazide (HYDRODIURIL) 25 MG tablet Take 1 tablet (25 mg total) by mouth daily.  Marland Kitchen L-THEANINE  PO Take by mouth daily.  Marland Kitchen losartan (COZAAR) 50 MG tablet Take 1 tablet (50 mg total) by mouth daily.  Marland Kitchen MAGNESIUM PO Take by mouth daily.  . Multiple Vitamins-Minerals (MULTIVITAMIN WITH MINERALS) tablet Take 1 tablet by mouth daily.  . Multiple Vitamins-Minerals (VITAMIN D3 COMPLETE PO) Take 1 tablet by mouth daily.  . Potassium 99 MG TABS Take by mouth daily.  . ranitidine (ZANTAC) 150 MG capsule Take 150 mg by mouth daily.   Marland Kitchen VALERIAN ROOT PO Take by mouth daily.  . Zinc 50 MG CAPS Take 1 capsule by mouth daily.     ROS: Pertinent items noted in HPI and remainder of comprehensive ROS otherwise negative.  Labs/Other Tests and Data Reviewed:     Recent Labs: 09/18/2018: ALT 43; BUN 16; Creatinine, Ser 1.00; Hemoglobin 13.4; Platelets 256; Potassium 4.3; Sodium 135   Recent Lipid Panel Lab Results  Component Value Date/Time   CHOL 127 03/19/2018 08:47 AM   CHOL 126 12/02/2013 09:20 AM   TRIG 195 (H) 03/19/2018 08:47 AM   TRIG 157 (H) 12/02/2013 09:20 AM   HDL 32 (L) 03/19/2018 08:47 AM   HDL 33 (L) 12/02/2013 09:20 AM   CHOLHDL 4.0 03/19/2018 08:47 AM   CHOLHDL 4.2 01/25/2016 09:15 AM   LDLCALC 56 03/19/2018 08:47 AM   LDLCALC 62 12/02/2013 09:20 AM    Wt Readings from Last 3 Encounters:  03/24/19 180 lb (81.6 kg)  09/18/18 174 lb (78.9 kg)  03/19/18 185 lb (83.9 kg)     Exam:    Vital Signs:  BP 120/75   Wt 180 lb (81.6 kg)   BMI 29.05 kg/m    General appearance: alert and no distress Lungs: No visual respiratory difficulty Extremities: extremities normal, atraumatic, no cyanosis or edema Skin: Skin color, texture, turgor normal. No rashes or lesions Neurologic: Grossly normal Psych: Pleasant  ASSESSMENT & PLAN:    1. Hypertension-controlled 2. Dyslipidemia 3. Right bundle branch block  Mr. Douglas Mcguire overall seems to be doing well.  He is employed and it appears his Douglas Mcguire is much improved.  His blood pressure is well controlled.  He is due for repeat labs which were done a year ago.  He is not established with a primary care provider.  He said he may work for another year and officially retire but possibly work part-time.  I will go ahead and order a CMET, CBC, PSA, fasting lipid profile and A1c.  COVID-19 Education: The signs and symptoms of COVID-19 were discussed with the patient and how to seek care for testing (follow up with PCP or arrange E-visit).  The importance of social distancing was discussed today.  Patient Risk:   After full review of this patients clinical status, I feel that they are at least moderate risk at this time.  Time:   Today, I have spent 25 minutes with the patient with  telehealth technology discussing dyslipidemia, hypertension, lab work.     Medication Adjustments/Labs and Tests Ordered: Current medicines are reviewed at length with the patient today.  Concerns regarding medicines are outlined above.   Tests Ordered: Orders Placed This Encounter  Procedures  . CBC  . Lipid panel  . Hemoglobin A1c  . Comprehensive metabolic panel  . PSA    Medication Changes: No orders of the defined types were placed in this encounter.   Disposition:  in 1 year(s)  Pixie Casino, MD, FACC, [place] Director of the  Advanced Lipid Disorders &  Cardiovascular Risk Reduction Clinic Diplomate of the American Board of Clinical Lipidology Attending Cardiologist  Direct Dial: [phone]  Fax: [phone]  Website:  [email or web address]  Chrystie Nose, MD  03/24/2019 9:37 AM

## 2019-03-24 NOTE — Telephone Encounter (Signed)
Patient called to review e-visit instructions. Patient aware that the following changes have been made: NONE Patient aware that they will need the following labs: fasting labs - lipid panel, A1c, CBC, CMET, PSA Patient aware that they will need the following test(s): NONE  Recall for 12 months entered.   No further assistance needed at this time.

## 2019-03-24 NOTE — Patient Instructions (Signed)
Medication Instructions:  Your physician recommends that you continue on your current medications as directed. Please refer to the Current Medication list given to you today.  *If you need a refill on your cardiac medications before your next appointment, please call your pharmacy*  Lab Work: FASTING lab work - lipid panel, A1c, CBC, CMET, PSA If you have labs (blood work) drawn today and your tests are completely normal, you will receive your results only by: Marland Kitchen MyChart Message (if you have MyChart) OR . A paper copy in the mail If you have any lab test that is abnormal or we need to change your treatment, we will call you to review the results.  Testing/Procedures: NONE  Follow-Up: At [facility], you and your health needs are our priority.  As part of our continuing mission to provide you with exceptional heart care, we have created designated Provider Care Teams.  These Care Teams include your primary Cardiologist (physician) and Advanced Practice Providers (APPs -  Physician Assistants and Nurse Practitioners) who all work together to provide you with the care you need, when you need it.  Your next appointment:   12 month(s)  The format for your next appointment:   In Person  Provider:   You may see Dr. Rennis Golden or one of the following Advanced Practice Providers on your designated Care Team:    Azalee Course, PA-C  Micah Flesher, [place] or   Judy Pimple, [place]   Other Instructions

## 2019-03-24 NOTE — Telephone Encounter (Signed)
Follow Up:  Pt wanted to know if Dr Rennis Golden completed his Virtual Visit?

## 2019-03-25 ENCOUNTER — Other Ambulatory Visit: Payer: Self-pay | Admitting: Internal Medicine

## 2019-03-25 MED ORDER — LOSARTAN POTASSIUM 50 MG PO TABS: 50.0000 mg | ORAL_TABLET | Freq: Every day | ORAL | 3 refills | Status: DC

## 2019-03-25 MED ORDER — HYDROCHLOROTHIAZIDE 25 MG PO TABS: 25.0000 mg | ORAL_TABLET | Freq: Every day | ORAL | 3 refills | Status: DC

## 2019-03-25 MED ORDER — ATORVASTATIN CALCIUM 80 MG PO TABS: ORAL_TABLET | ORAL | 3 refills | Status: DC

## 2019-04-04 LAB — COMPREHENSIVE METABOLIC PANEL
ALT: 53 IU/L — ABNORMAL HIGH (ref 0–44)
AST: 39 IU/L (ref 0–40)
Albumin/Globulin Ratio: 1.5 (ref 1.2–2.2)
Albumin: 4.3 g/dL (ref 3.8–4.8)
Alkaline Phosphatase: 95 IU/L (ref 39–117)
BUN/Creatinine Ratio: 14 (ref 10–24)
BUN: 14 mg/dL (ref 8–27)
Bilirubin Total: 0.4 mg/dL (ref 0.0–1.2)
CO2: 21 mmol/L (ref 20–29)
Calcium: 9.3 mg/dL (ref 8.6–10.2)
Chloride: 102 mmol/L (ref 96–106)
Creatinine, Ser: 1 mg/dL (ref 0.76–1.27)
GFR calc Af Amer: 93 mL/min/{1.73_m2} (ref >59)
GFR calc non Af Amer: 81 mL/min/{1.73_m2} (ref >59)
Globulin, Total: 2.8 g/dL (ref 1.5–4.5)
Glucose: 105 mg/dL — ABNORMAL HIGH (ref 65–99)
Potassium: 4.4 mmol/L (ref 3.5–5.2)
Sodium: 140 mmol/L (ref 134–144)
Total Protein: 7.1 g/dL (ref 6.0–8.5)

## 2019-04-04 LAB — CBC
Hematocrit: 44 % (ref 37.5–51.0)
Hemoglobin: 14.9 g/dL (ref 13.0–17.7)
MCH: 31 pg (ref 26.6–33.0)
MCHC: 33.9 g/dL (ref 31.5–35.7)
MCV: 92 fL (ref 79–97)
Platelets: 165 10*3/uL (ref 150–450)
RBC: 4.8 x10E6/uL (ref 4.14–5.80)
RDW: 13.7 % (ref 11.6–15.4)
WBC: 6.6 10*3/uL (ref 3.4–10.8)

## 2019-04-04 LAB — LIPID PANEL
Chol/HDL Ratio: 4.2 ratio (ref 0.0–5.0)
Cholesterol, Total: 137 mg/dL (ref 100–199)
HDL: 33 mg/dL — ABNORMAL LOW (ref >39)
LDL Chol Calc (NIH): 70 mg/dL (ref 0–99)
Triglycerides: 201 mg/dL — ABNORMAL HIGH (ref 0–149)
VLDL Cholesterol Cal: 34 mg/dL (ref 5–40)

## 2019-04-04 LAB — HEMOGLOBIN A1C
Est. average glucose Bld gHb Est-mCnc: 123 mg/dL
Hgb A1c MFr Bld: 5.9 % — ABNORMAL HIGH (ref 4.8–5.6)

## 2019-04-04 LAB — PSA: Prostate Specific Ag, Serum: 0.9 ng/mL (ref 0.0–4.0)

## 2020-01-10 ENCOUNTER — Other Ambulatory Visit: Payer: Self-pay | Admitting: Internal Medicine

## 2020-01-10 MED ORDER — LOSARTAN POTASSIUM 50 MG PO TABS: 50.0000 mg | ORAL_TABLET | Freq: Every day | ORAL | 0 refills | Status: DC

## 2020-01-10 MED ORDER — ATORVASTATIN CALCIUM 80 MG PO TABS: ORAL_TABLET | ORAL | 0 refills | Status: DC

## 2020-01-10 MED ORDER — HYDROCHLOROTHIAZIDE 25 MG PO TABS: 25.0000 mg | ORAL_TABLET | Freq: Every day | ORAL | 0 refills | Status: DC

## 2020-01-23 ENCOUNTER — Other Ambulatory Visit: Payer: Self-pay | Admitting: Internal Medicine

## 2020-01-23 MED ORDER — LOSARTAN POTASSIUM 50 MG PO TABS: 50.0000 mg | ORAL_TABLET | Freq: Every day | ORAL | 0 refills | Status: DC

## 2020-01-23 MED ORDER — ATORVASTATIN CALCIUM 80 MG PO TABS: ORAL_TABLET | ORAL | 0 refills | Status: DC

## 2020-01-23 MED ORDER — HYDROCHLOROTHIAZIDE 25 MG PO TABS: 25.0000 mg | ORAL_TABLET | Freq: Every day | ORAL | 0 refills | Status: DC

## 2020-03-26 ENCOUNTER — Encounter: Payer: Self-pay | Admitting: Internal Medicine

## 2020-03-26 ENCOUNTER — Other Ambulatory Visit: Payer: Self-pay

## 2020-03-26 ENCOUNTER — Ambulatory Visit (INDEPENDENT_AMBULATORY_CARE_PROVIDER_SITE_OTHER): Payer: 59 | Admitting: Internal Medicine

## 2020-03-26 VITALS — BP 136/84 | HR 71 | Ht 66.0 in | Wt 186.2 lb

## 2020-03-26 DIAGNOSIS — Z131 Encounter for screening for diabetes mellitus: Secondary | ICD-10-CM | POA: Diagnosis not present

## 2020-03-26 DIAGNOSIS — Z125 Encounter for screening for malignant neoplasm of prostate: Secondary | ICD-10-CM | POA: Diagnosis not present

## 2020-03-26 DIAGNOSIS — I10 Essential (primary) hypertension: Secondary | ICD-10-CM | POA: Diagnosis not present

## 2020-03-26 DIAGNOSIS — E785 Hyperlipidemia, unspecified: Secondary | ICD-10-CM | POA: Diagnosis not present

## 2020-03-26 DIAGNOSIS — Z79899 Other long term (current) drug therapy: Secondary | ICD-10-CM

## 2020-03-26 NOTE — Progress Notes (Signed)
OFFICE NOTE  Chief Complaint:  Annual follow-up  Primary Care Physician: Patient, No Pcp Per  HPI:  Douglas Mcguire is a 65 year old gentleman who I see annually for hypertension which has been well controlled, as well as dyslipidemia. 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 He had managed to lose some weight, however, has gained some of that back. His blood pressures remain stable. He does not do any specific exercise but is active. He denies any chest pain, shortness of breath, palpitations, presyncope, syncopal symptoms.   Douglas Mcguire returns today for followup. He reports feeling very well. Denies any chest pain or shortness of breath. He reports he started eating a healthier diet and he seemed a little bit of weight loss. He is taking atorvastatin 80 mg now and is due for repeat test of his cholesterol. Blood pressures been well controlled. He reports a history of fatty liver the past and we may need to survey his liver enzymes.  I had the pleasure of seeing Douglas Mcguire back in the office today for follow-up. Overall he is doing well denies any chest pain or shortness of breath. Occasionally he sees some black dots, which sound like maybe floaters. He has had good control of his blood pressure. She's on Lipitor 80 and had a particle number less than 1000 at his last office visit. He is due for repeat blood work.  01/25/2016  Douglas Mcguire returns today for follow-up. He has been asymptomatic over the past year. He denies chest pain or worsening shortness of breath. He is due for refills of medication. He does not have a PCP. He has not had a screening colonoscopy or any routine bloodwork. He has a stable RBBB which was new last year, but has been asymptomatic with this.  02/06/2017  Douglas Mcguire was seen today in follow-up.  This is his annual visit.  He continues to be active and is doing more physical lifting with  regards to furniture delivery.  He works for a company that is up and down the [employer].  He denies any chest pain or worsening shortness of breath with this activity.  He is still not settled with a PCP.  I had referred him for a screening colonoscopy but he I did not make that appointment.  He was asking about the possibility of doing Cologuard testing.  I said it would be best for him to establish with a PCP with regards to that as we do not carry the kids.  He is fasting today and interested in doing annual blood work.  EKG shows sinus rhythm with stable right bundle branch block.  03/19/2018  Douglas Mcguire is seen today in annual follow-up.  Last year was unfortunately eventful for him.  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  Heart wise he denies any chest pain or shortness of breath.  He is active, but does not exercise.  He has not had any lab work or seen a primary care provider.  He primarily gets medications through [place].  I again discussed possibility screening colonoscopy with him however he refused it and now is again interested in the Cologuard.  He will be due for repeat lab work and refills of his prescriptions.  03/26/2020  Douglas Mcguire is seen today for follow-up.  I last saw him via virtual visit a  year ago.  Since then he says he has been doing well.  He continues to work for [employer].  He says he plans to work for another couple years.  He denies any chest pain or shortness of breath.  He says he does get tired during some of his work but is able to continue to be active.  He is due for repeat lab work.  His last lab work was about a year ago.  Does not have an active primary care provider but uses an urgent care and [place] for some issues.  He also pointed out a skin lesion on his right shoulder to me today.  He says this is been noted for several months.  It is raised and hard.  He says it is not changed  colors.  PMHx:  Past Medical History:  Diagnosis Date  . Allergy   . Dyslipidemia   . Dysrhythmia   . Epilepsy (HCC)   . GERD (gastroesophageal reflux disease)   . Hepatitis   . Hypertension     No past surgical history on file.  FAMHx:  Family History  Problem Relation Age of Onset  . Cancer Father     SOCHx:   reports that he quit smoking about 37 years ago. His smoking use included cigarettes. He has a 2.00 pack-year smoking history. He has never used smokeless tobacco. He reports that he does not drink alcohol and does not use drugs.  ALLERGIES:  No Known Allergies  ROS: Pertinent items noted in HPI and remainder of comprehensive ROS otherwise negative.  HOME MEDS: Current Outpatient Medications  Medication Sig Dispense Refill  . ASHWAGANDHA PO Take by mouth daily.    Marland Kitchen aspirin 81 MG tablet Take 81 mg by mouth daily.     Marland Kitchen atorvastatin (LIPITOR) 80 MG tablet TAKE 1 TABLET BY MOUTH ONCE DAILY AT  6PM 90 tablet 0  . B Complex Vitamins (B COMPLEX PO) Take by mouth daily.    . calcium carbonate (OS-CAL) 600 MG TABS tablet Take 600 mg by mouth 2 (two) times daily with a meal.    . Chlorpheniramine Maleate (ALLERGY RELIEF PO) Take by mouth daily.    Marland Kitchen FAMOTIDINE PO Take 1 tablet by mouth at bedtime.     . fish oil-omega-3 fatty acids 1000 MG capsule Take 1 g by mouth 2 (two) times daily.     . hydrochlorothiazide (HYDRODIURIL) 25 MG tablet Take 1 tablet (25 mg total) by mouth daily. 90 tablet 0  . losartan (COZAAR) 50 MG tablet Take 1 tablet (50 mg total) by mouth daily. 90 tablet 0  . MAGNESIUM PO Take by mouth daily.    . Multiple Vitamins-Minerals (MULTIVITAMIN WITH MINERALS) tablet Take 1 tablet by mouth daily.    . Multiple Vitamins-Minerals (VITAMIN D3 COMPLETE PO) Take 1 tablet by mouth daily.    . Potassium 99 MG TABS Take by mouth daily.    . ranitidine (ZANTAC) 150 MG capsule Take 150 mg by mouth daily.     Marland Kitchen VALERIAN ROOT PO Take by mouth daily.    . Zinc 50  MG CAPS Take 1 capsule by mouth daily.    Marland Kitchen ibuprofen (ADVIL) 800 MG tablet Take 800 mg by mouth 3 (three) times daily.    Marland Kitchen L-THEANINE PO Take by mouth daily.     No current facility-administered medications for this visit.    LABS/IMAGING: No results found for this or any previous visit (from the past 48  hour(s)). No results found.  VITALS: BP 136/84 (BP Location: Left Arm, Patient Position: Sitting)   Pulse 71   Ht 5\' 6"  (1.676 m)   Wt 186 lb 3.2 oz (84.5 kg)   SpO2 98%   BMI 30.05 kg/m   EXAM: General appearance: alert and no distress Neck: no carotid bruit, no JVD and thyroid not enlarged, symmetric, no tenderness/mass/nodules Lungs: clear to auscultation bilaterally Heart: regular rate and rhythm, S1, S2 normal, no murmur, click, rub or gallop Abdomen: soft, non-tender; bowel sounds normal; no masses,  no organomegaly Extremities: extremities normal, atraumatic, no cyanosis or edema Pulses: 2+ and symmetric Skin: Skin color, texture, turgor normal. No rashes or lesions or 3 mm raised, cornified light-colored lesion on the right shoulder Neurologic: Grossly normal Psych: Pleasant  EKG: Normal sinus rhythm 71, RBBB-personally reviewed  ASSESSMENT: 1. Hypertension-controlled 2. Dyslipidemia 3. Right bundle branch block 4. Skin lesion - ?early cutaneous horn  PLAN: 1.   Douglas Mcguire denies any chest pain or worsening shortness of breath.  His cholesterol was decent last year except for his triglycerides were elevated.  He is due for repeat labs.  Does not have a PCP therefore will get comprehensive labs including metabolic profile, CBC, A1c, lipid profile and a PSA.  He did point out a skin lesion on his right shoulder to me today.  This could be an early cutaneous horn? It appears cornified - hard.  I encouraged him though to follow-up with a dermatologist for excisional biopsy to determine if it may be cancerous or not.  Follow-up with me annually or sooner as  necessary.  Duffy Rhody, MD, [facility], FACP  [place]  [facility]  Medical Director of the Advanced Lipid Disorders &  Cardiovascular Risk Reduction Clinic Attending Cardiologist  Direct Dial: [phone]  Fax: [phone]  Website:  [email or web address] Avi Archuleta 03/26/2020, 8:23 AM

## 2020-03-26 NOTE — Patient Instructions (Signed)
Medication Instructions:  Your physician recommends that you continue on your current medications as directed. Please refer to the Current Medication list given to you today.  *If you need a refill on your cardiac medications before your next appointment, please call your pharmacy*   Lab Work: CBC, CMET, Lipid Panel, A1c, PSA today   If you have labs (blood work) drawn today and your tests are completely normal, you will receive your results only by: Marland Kitchen MyChart Message (if you have MyChart) OR . A paper copy in the mail If you have any lab test that is abnormal or we need to change your treatment, we will call you to review the results.   Testing/Procedures: NONE   Follow-Up: At [facility], you and your health needs are our priority.  As part of our continuing mission to provide you with exceptional heart care, we have created designated Provider Care Teams.  These Care Teams include your primary Cardiologist (physician) and Advanced Practice Providers (APPs -  Physician Assistants and Nurse Practitioners) who all work together to provide you with the care you need, when you need it.  We recommend signing up for the patient portal called "MyChart".  Sign up information is provided on this After Visit Summary.  MyChart is used to connect with patients for Virtual Visits (Telemedicine).  Patients are able to view lab/test results, encounter notes, upcoming appointments, etc.  Non-urgent messages can be sent to your provider as well.   To learn more about what you can do with MyChart, go to [email or web address].    Your next appointment:   12 month(s)  The format for your next appointment:   In Person  Provider:   You may see Dr. Rennis Golden or one of the following Advanced Practice Providers on your designated Care Team:    Azalee Course, PA-C  Micah Flesher, [place] or   Judy Pimple, [place]    Other Instructions

## 2020-03-27 LAB — COMPREHENSIVE METABOLIC PANEL
ALT: 68 IU/L — ABNORMAL HIGH (ref 0–44)
AST: 54 IU/L — ABNORMAL HIGH (ref 0–40)
Albumin/Globulin Ratio: 1.3 (ref 1.2–2.2)
Albumin: 4.3 g/dL (ref 3.8–4.8)
Alkaline Phosphatase: 88 IU/L (ref 44–121)
BUN/Creatinine Ratio: 12 (ref 10–24)
BUN: 11 mg/dL (ref 8–27)
Bilirubin Total: 0.6 mg/dL (ref 0.0–1.2)
CO2: 26 mmol/L (ref 20–29)
Calcium: 9.8 mg/dL (ref 8.6–10.2)
Chloride: 101 mmol/L (ref 96–106)
Creatinine, Ser: 0.91 mg/dL (ref 0.76–1.27)
GFR calc Af Amer: 104 mL/min/{1.73_m2} (ref >59)
GFR calc non Af Amer: 90 mL/min/{1.73_m2} (ref >59)
Globulin, Total: 3.4 g/dL (ref 1.5–4.5)
Glucose: 112 mg/dL — ABNORMAL HIGH (ref 65–99)
Potassium: 4.5 mmol/L (ref 3.5–5.2)
Sodium: 140 mmol/L (ref 134–144)
Total Protein: 7.7 g/dL (ref 6.0–8.5)

## 2020-03-27 LAB — CBC
Hematocrit: 46.3 % (ref 37.5–51.0)
Hemoglobin: 16 g/dL (ref 13.0–17.7)
MCH: 33.1 pg — ABNORMAL HIGH (ref 26.6–33.0)
MCHC: 34.6 g/dL (ref 31.5–35.7)
MCV: 96 fL (ref 79–97)
Platelets: 182 10*3/uL (ref 150–450)
RBC: 4.83 x10E6/uL (ref 4.14–5.80)
RDW: 12.8 % (ref 11.6–15.4)
WBC: 7.1 10*3/uL (ref 3.4–10.8)

## 2020-03-27 LAB — LIPID PANEL
Chol/HDL Ratio: 3.9 ratio (ref 0.0–5.0)
Cholesterol, Total: 139 mg/dL (ref 100–199)
HDL: 36 mg/dL — ABNORMAL LOW (ref >39)
LDL Chol Calc (NIH): 69 mg/dL (ref 0–99)
Triglycerides: 204 mg/dL — ABNORMAL HIGH (ref 0–149)
VLDL Cholesterol Cal: 34 mg/dL (ref 5–40)

## 2020-03-27 LAB — HEMOGLOBIN A1C
Est. average glucose Bld gHb Est-mCnc: 114 mg/dL
Hgb A1c MFr Bld: 5.6 % (ref 4.8–5.6)

## 2020-03-27 LAB — PSA: Prostate Specific Ag, Serum: 0.8 ng/mL (ref 0.0–4.0)

## 2020-05-13 ENCOUNTER — Other Ambulatory Visit: Payer: Self-pay

## 2020-05-14 MED ORDER — HYDROCHLOROTHIAZIDE 25 MG PO TABS: 25.0000 mg | ORAL_TABLET | Freq: Every day | ORAL | 2 refills | Status: DC

## 2020-06-18 ENCOUNTER — Other Ambulatory Visit: Payer: Self-pay

## 2020-06-19 MED ORDER — ATORVASTATIN CALCIUM 80 MG PO TABS: ORAL_TABLET | ORAL | 2 refills | Status: DC

## 2020-06-19 NOTE — Telephone Encounter (Signed)
Rx(s) sent to pharmacy electronically.  

## 2020-06-21 IMAGING — US US RENAL
1 series · 14 of 22 positions shown · non-contrast
Comparison: CT abdomen and pelvis 09/18/2018

CLINICAL DATA: RIGHT flank pain radiating to RIGHT groin since
yesterday

EXAM:
RENAL / URINARY TRACT ULTRASOUND COMPLETE

[Series 1: us renal · 14 of 22 slices shown]
[im 1/22]
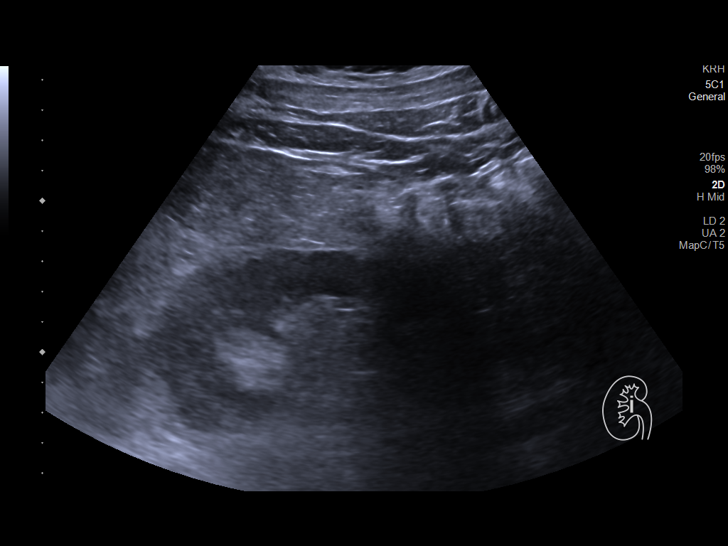
[im 3/22]
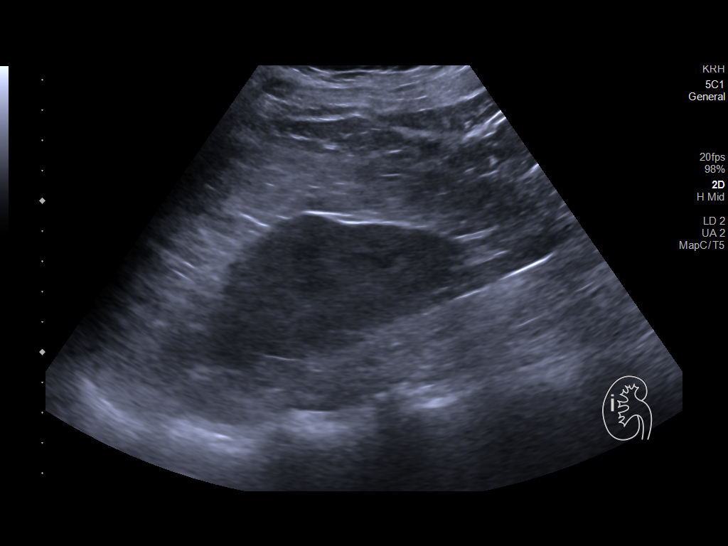
[im 4/22]
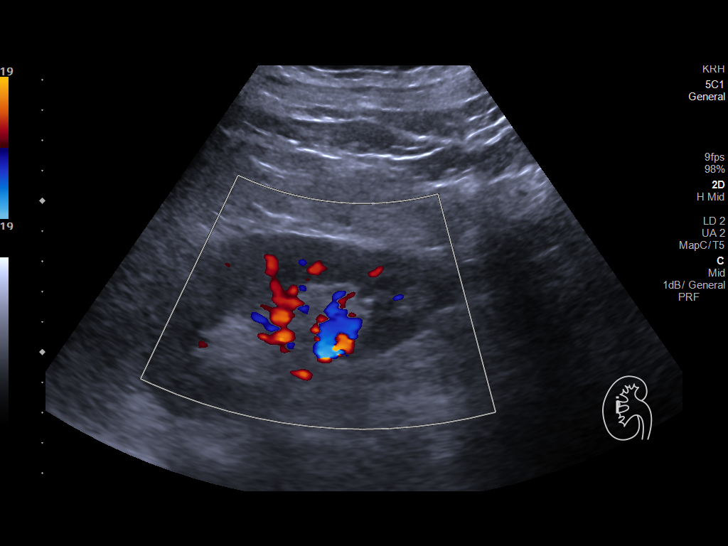
[im 6/22]
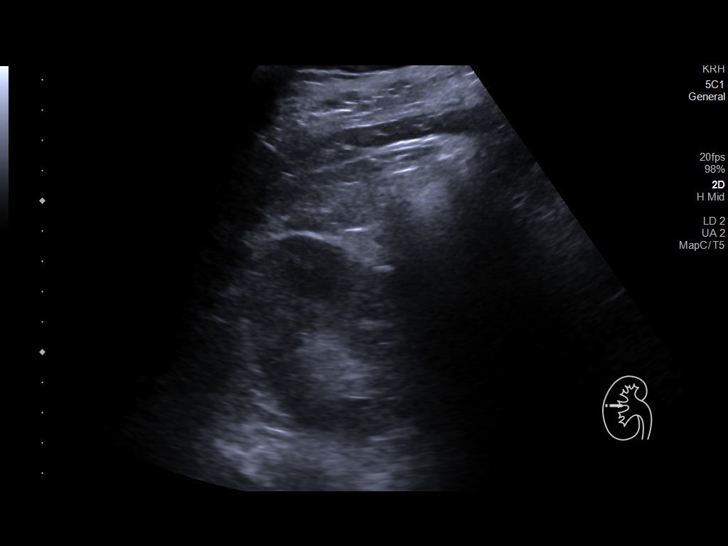
[im 8/22]
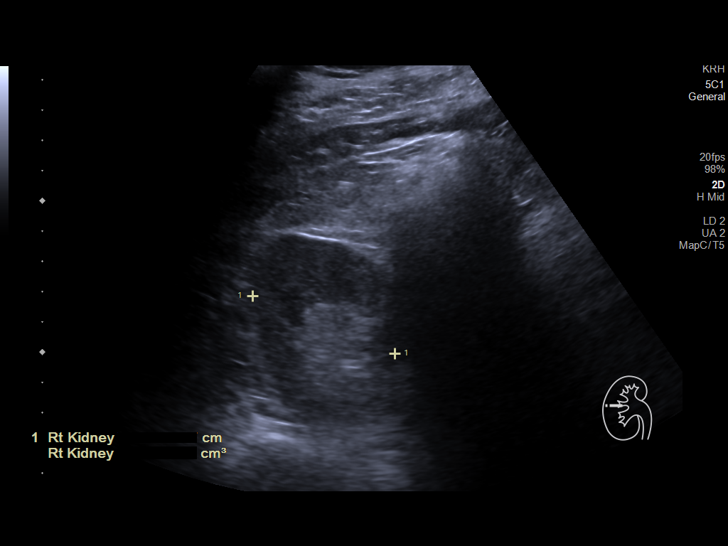
[im 9/22]
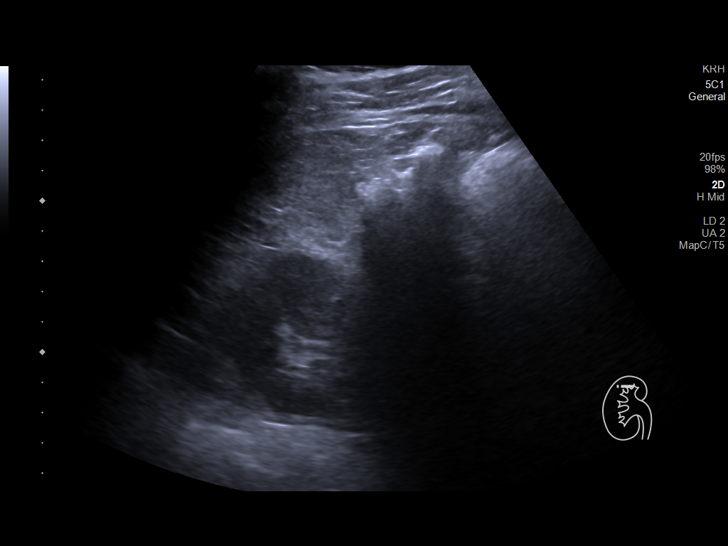
[im 11/22]
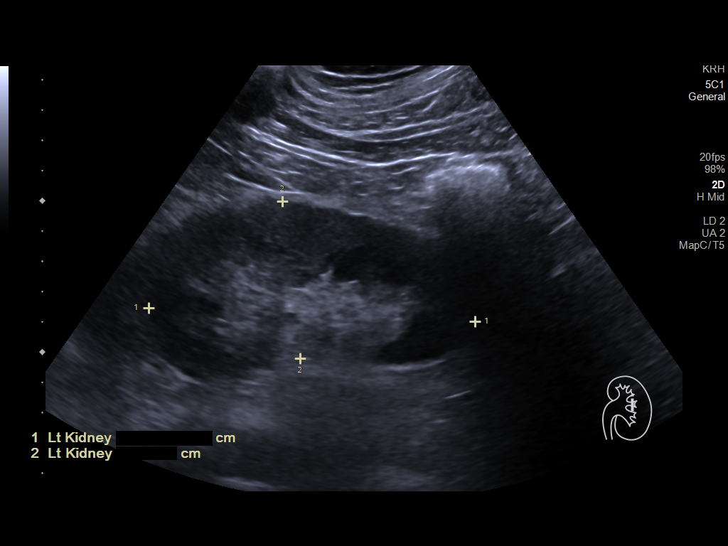
[im 12/22]
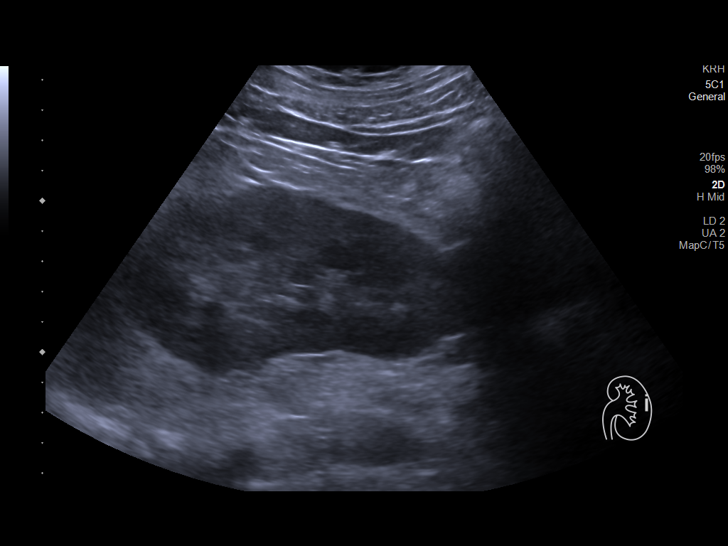
[im 14/22]
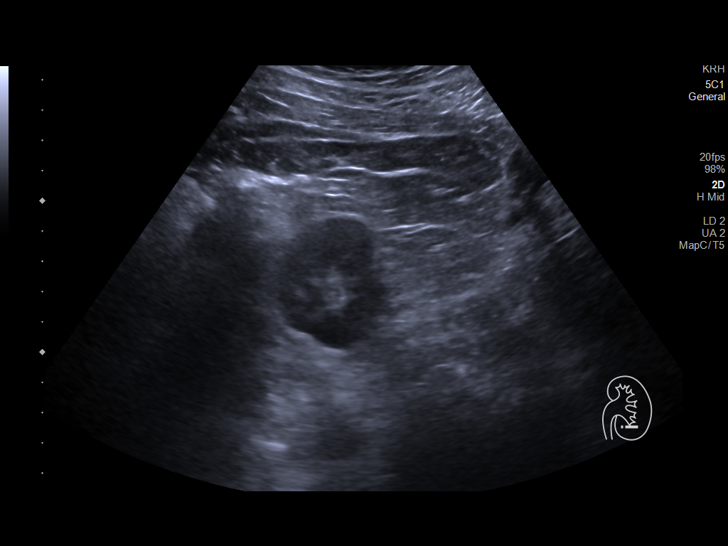
[im 15/22]
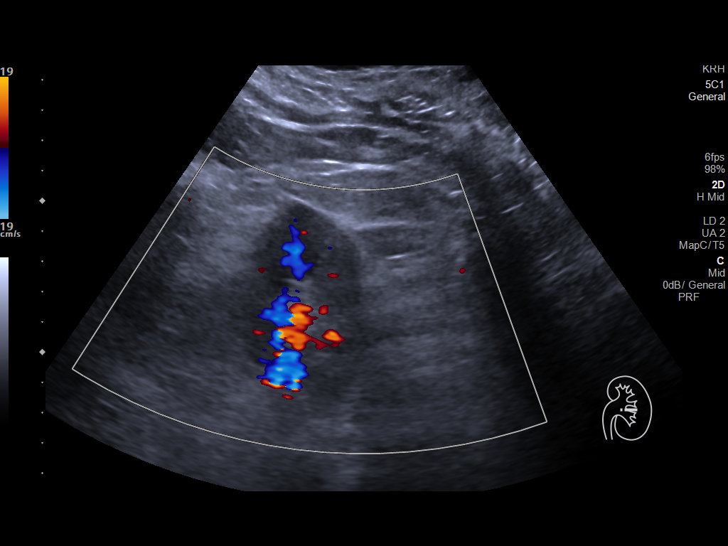
[im 17/22]
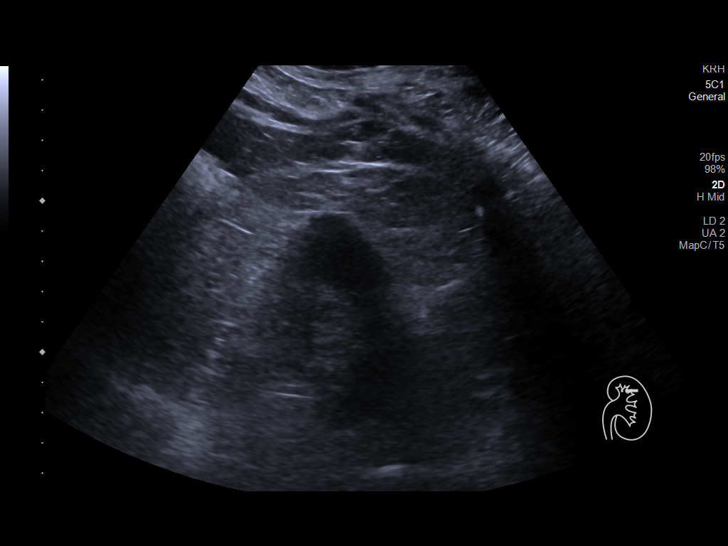
[im 19/22]
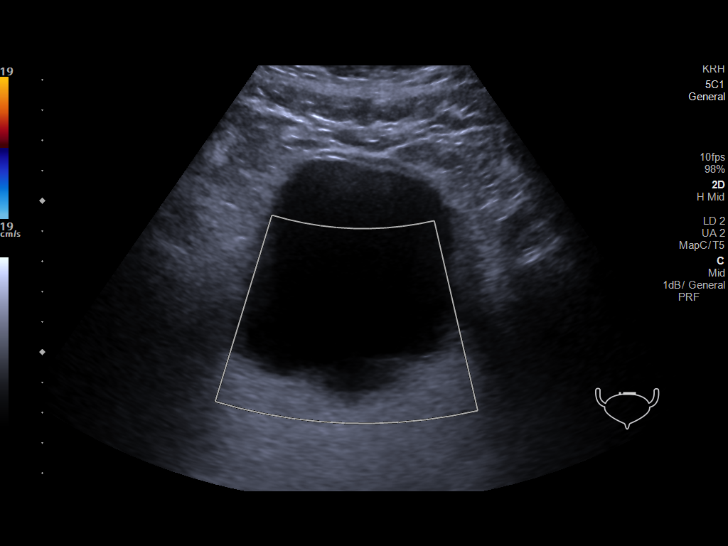
[im 20/22]
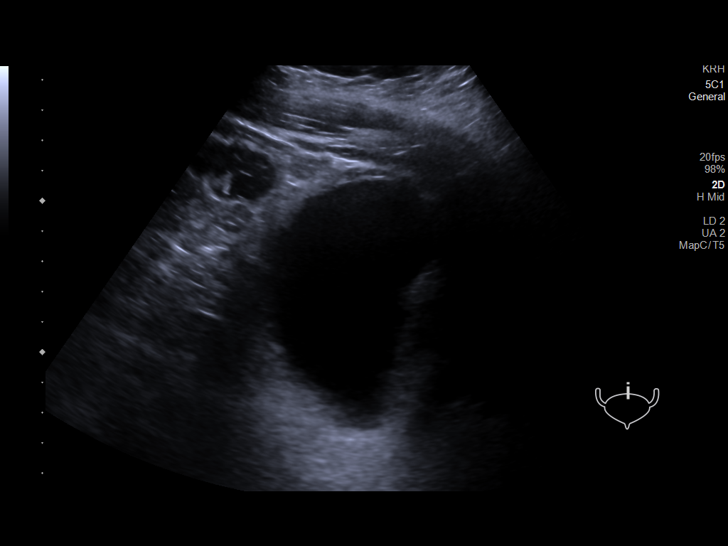
[im 22/22]
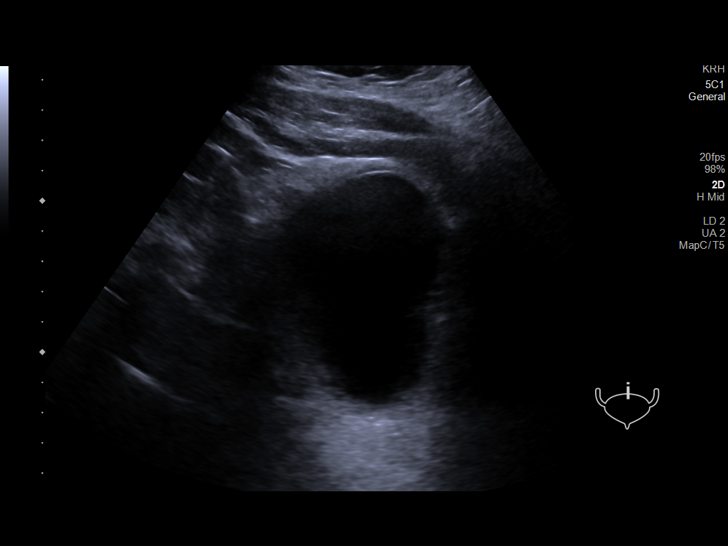

[14 of 22 positions shown; findings below may reference images not displayed]

FINDINGS: Right Kidney:

Renal measurements: 10.2 x 5.1 x 5.1 cm = volume: 139 mL . Normal
cortical thickness and echogenicity. No mass, hydronephrosis or
shadowing calcification.

Left Kidney:

Renal measurements: 10.8 x 5.2 x 5.1 cm = volume: 150 mL. Normal
cortical thickness and echogenicity. No mass, hydronephrosis or
shadowing calcification.

Bladder:

Appears normal for degree of bladder distention.
IMPRESSION: Normal exam.

## 2020-06-21 IMAGING — US ULTRASOUND SCROTUM DOPPLER COMPLETE
1 series · 14 of 25 positions shown · non-contrast
Comparison: CT scan dated 09/18/2018

CLINICAL DATA: Right testicle pain and swelling today.

EXAM:
SCROTAL ULTRASOUND
DOPPLER ULTRASOUND OF THE TESTICLES
TECHNIQUE: Complete ultrasound examination of the testicles, epididymis, and
other scrotal structures was performed. Color and spectral Doppler
ultrasound were also utilized to evaluate blood flow to the
testicles.

[Series 1: ultrasound scrotum doppler complete · 14 of 57 slices shown]
[im 1/57]
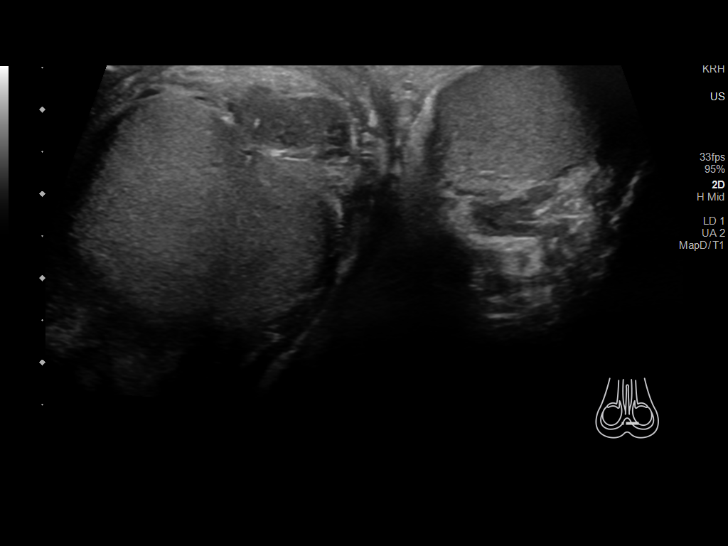
[im 5/57]
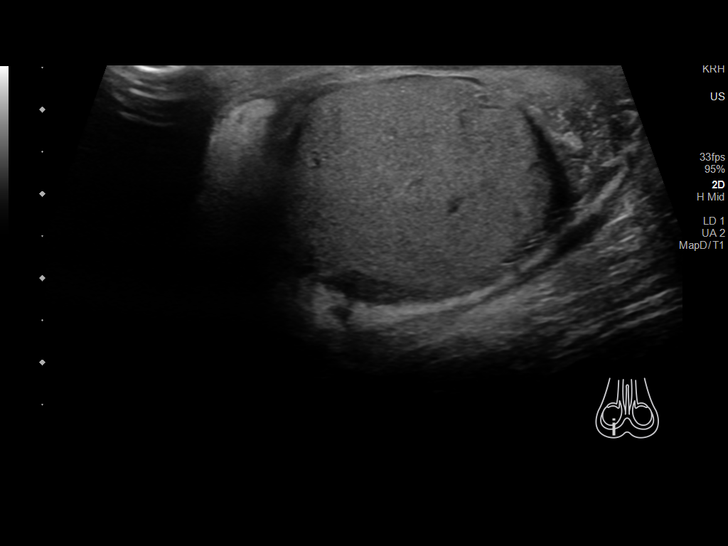
[im 10/57]
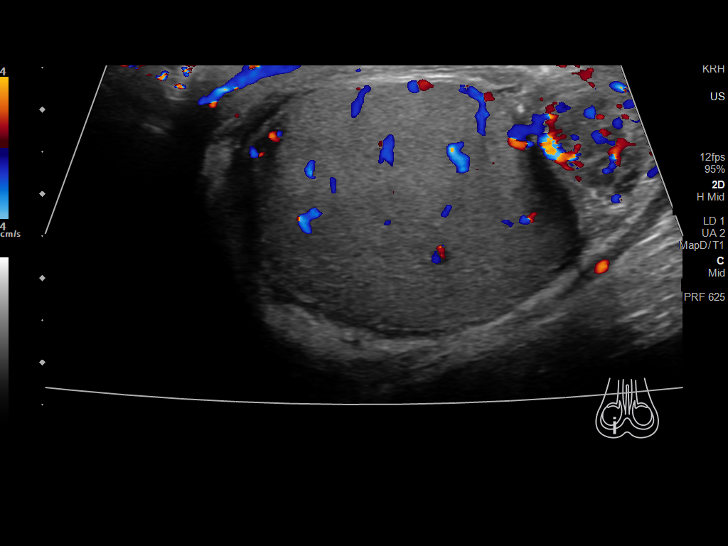
[im 15/57]
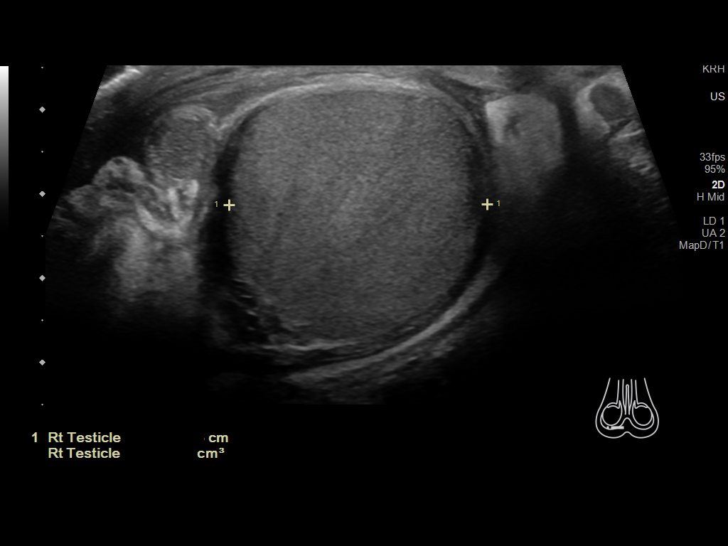
[im 19/57]
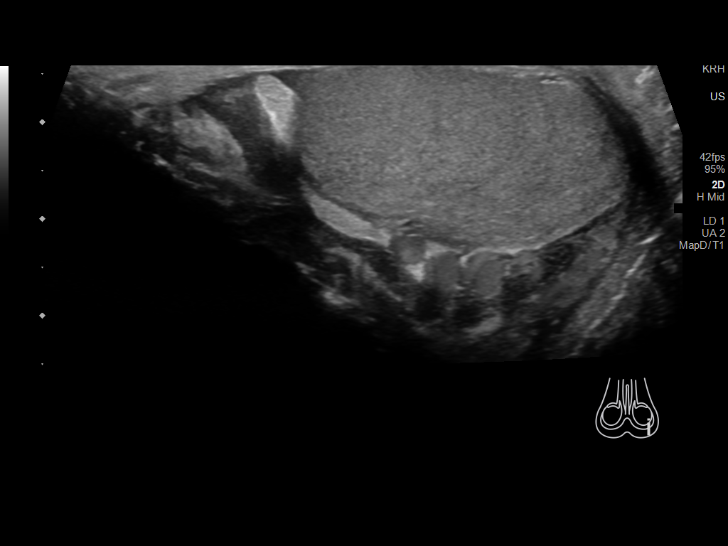
[im 22/57]
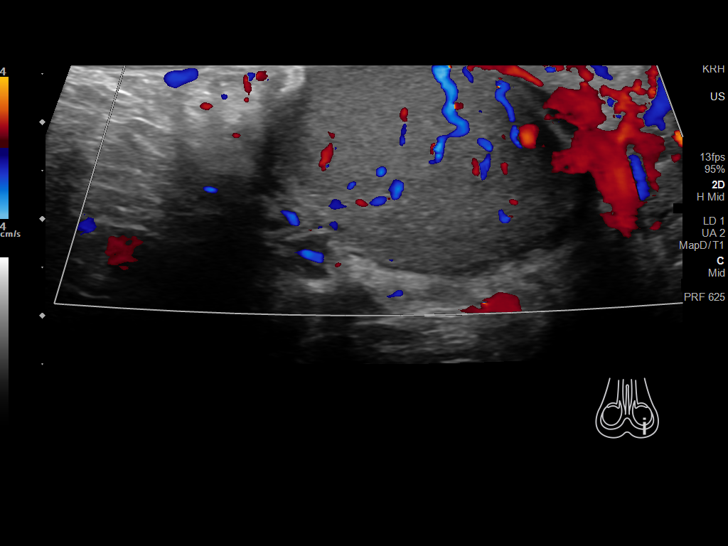
[im 26/57]
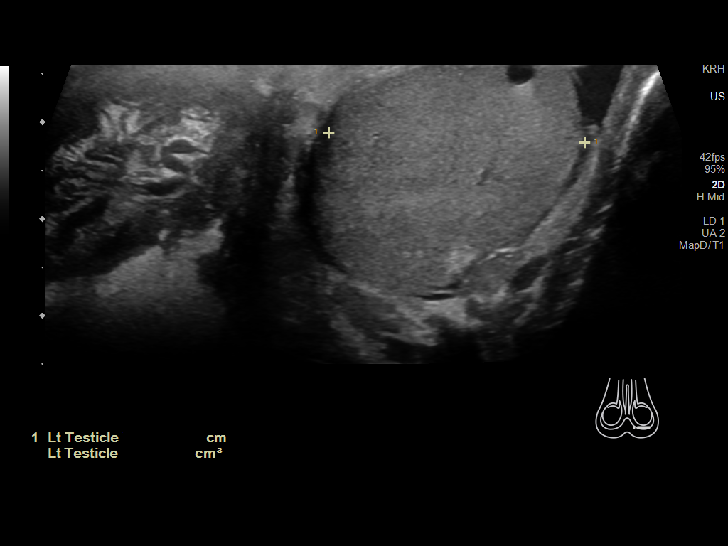
[im 31/57]
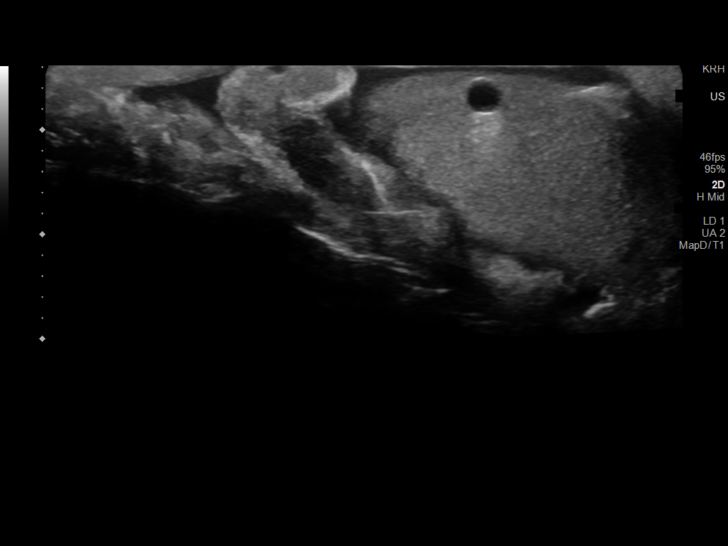
[im 36/57]
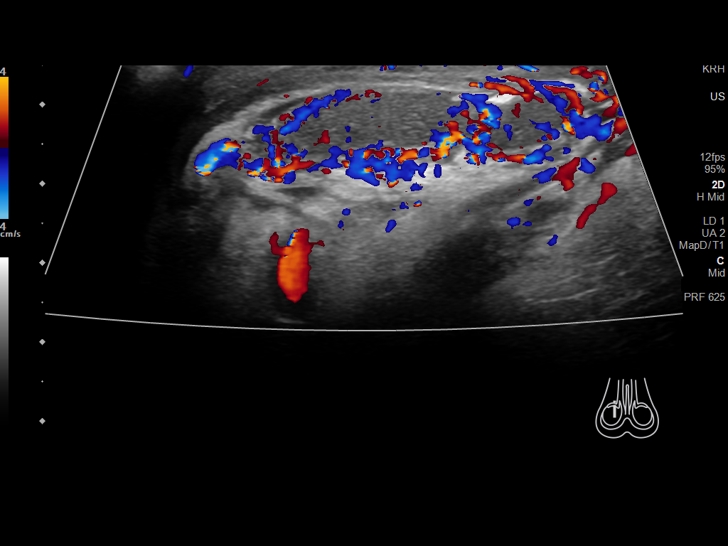
[im 38/57]
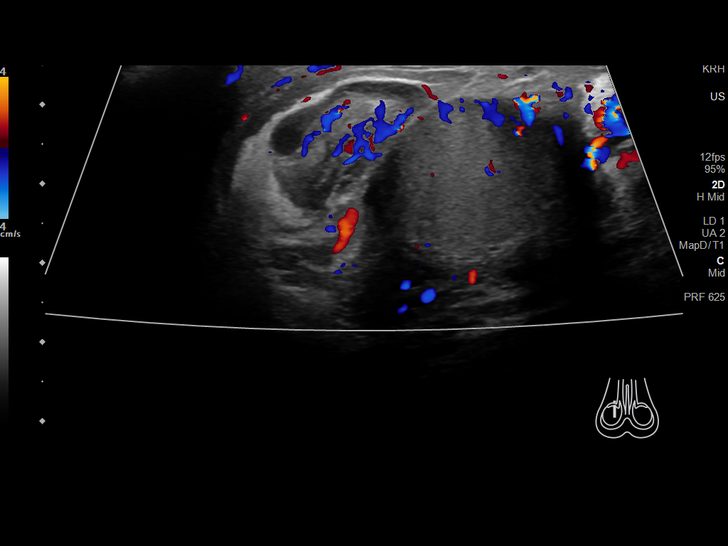
[im 43/57]
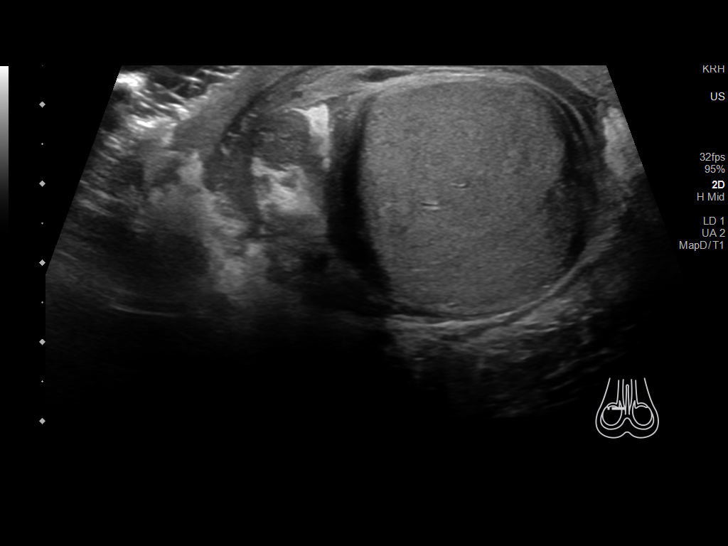
[im 47/57]
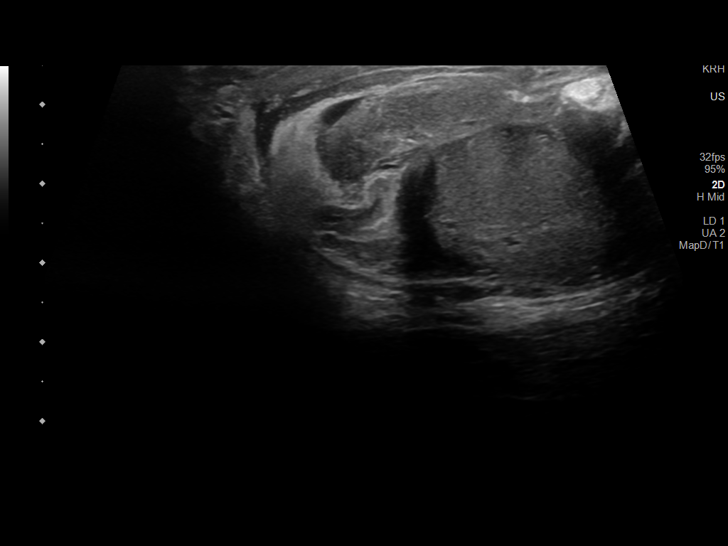
[im 52/57]
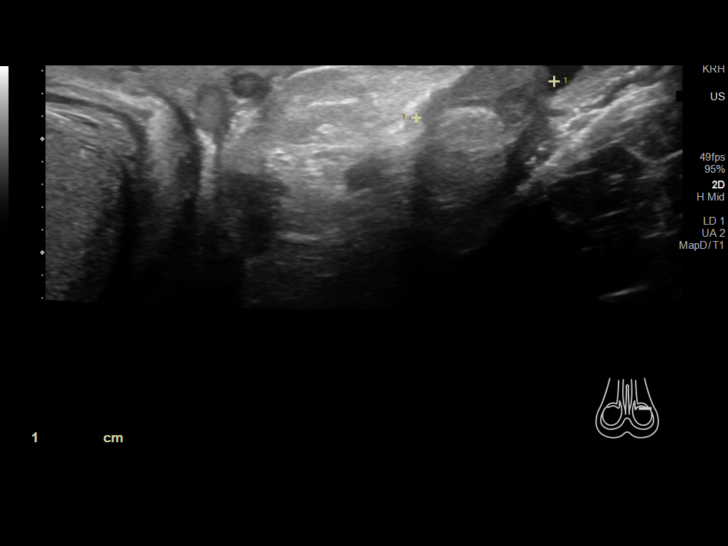
[im 57/57]
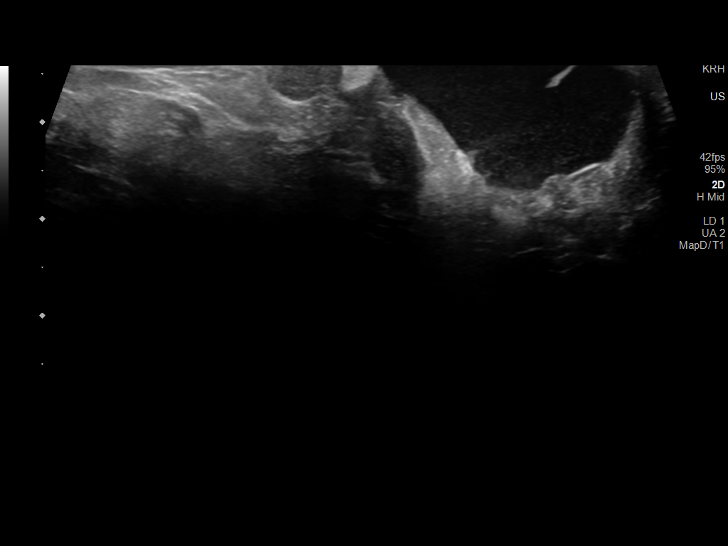

[14 of 25 positions shown; findings below may reference images not displayed]

FINDINGS: Right testicle

Measurements: 3.9 x 3.0 x 3.1 cm. No mass or microlithiasis
visualized.

Left testicle

Measurements: 3.9 x 2.4 x 2.7 cm. No solid mass or microlithiasis
visualized. 3 mm testicular cyst of no significance.

Right epididymis: The right epididymis is enlarged and hypervascular
consistent with epididymitis.

Left epididymis:  Normal in size and appearance.

Hydrocele:  Small bilateral hydroceles.

Varicocele: No definitive varicocele. However, prominent vessels are
noted in the right side of the scrotum on the CT scan of the same
date.

Pulsed Doppler interrogation of both testes demonstrates normal low
resistance arterial and venous waveforms bilaterally.
IMPRESSION: Right epididymitis.

## 2020-10-15 ENCOUNTER — Telehealth: Payer: Self-pay | Admitting: Internal Medicine

## 2020-10-15 NOTE — Telephone Encounter (Signed)
  *  STAT* If patient is at the pharmacy, call can be transferred to refill team.   1. Which medications need to be refilled? (please list name of each medication and dose if known) losartan (COZAAR) 50 MG tablet  2. Which pharmacy/location (including street and city if local pharmacy) is medication to be sent to? [facility] - [place], [place] - [address]  3. Do they need a 30 day or 90 day supply? 90 days

## 2020-10-16 MED ORDER — LOSARTAN POTASSIUM 50 MG PO TABS: 50.0000 mg | ORAL_TABLET | Freq: Every day | ORAL | 0 refills | Status: DC

## 2021-01-06 ENCOUNTER — Other Ambulatory Visit: Payer: Self-pay | Admitting: Internal Medicine

## 2021-01-13 ENCOUNTER — Other Ambulatory Visit: Payer: Self-pay | Admitting: Internal Medicine

## 2021-04-16 ENCOUNTER — Other Ambulatory Visit: Payer: Self-pay | Admitting: Internal Medicine

## 2021-04-16 ENCOUNTER — Telehealth: Payer: Self-pay | Admitting: Internal Medicine

## 2021-04-16 MED ORDER — LOSARTAN POTASSIUM 50 MG PO TABS: 50.0000 mg | ORAL_TABLET | Freq: Every day | ORAL | 1 refills | Status: DC

## 2021-04-16 NOTE — Telephone Encounter (Signed)
°*  STAT* If patient is at the pharmacy, call can be transferred to refill team.   1. Which medications need to be refilled? (please list name of each medication and dose if known)  losartan (COZAAR) 50 MG tablet atorvastatin (LIPITOR) 80 MG tablet  2. Which pharmacy/location (including street and city if local pharmacy) is medication to be sent to? [facility], [place] - [address] Phone:  [phone]  Fax:  [phone]      3. Do they need a 30 day or 90 day supply? 90 ds

## 2021-04-17 MED ORDER — ATORVASTATIN CALCIUM 80 MG PO TABS: ORAL_TABLET | ORAL | 0 refills | Status: DC

## 2021-04-17 NOTE — Telephone Encounter (Signed)
Refills has been sent to the pharmacy. 

## 2021-05-26 NOTE — Progress Notes (Signed)
? ?Cardiology Clinic Note  ? ?Patient Name: Douglas Mcguire ?Date of Encounter: 05/29/2021 ? ?Primary Care Provider:  Patient, No Pcp Per (Inactive) ?Primary Cardiologist:  Chrystie Nose, MD ? ?Patient Profile  ?  ?Douglas Mcguire 66 year old male presents the clinic today for follow-up evaluation of his hyperlipidemia and hypertension. ? ?Past Medical History  ?  ?Past Medical History:  ?Diagnosis Date  ? Allergy   ? Dyslipidemia   ? Dysrhythmia   ? Epilepsy (HCC)   ? GERD (gastroesophageal reflux disease)   ? Hepatitis   ? Hypertension   ? ?History reviewed. No pertinent surgical history. ? ?Allergies ? ?No Known Allergies ? ?History of Present Illness  ?  ?Douglas Mcguire has a PMH of essential hypertension, RBBB, GERD, and hyperlipidemia. ? ?He was seen virtually in follow-up 1/20.  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  He denied chest pain and shortness of breath.  He remained active but did not do a formal exercise regimen.  He completed his lab work with his PCP.  His medications were primarily filled through cardiology.  Screening colonoscopy was discussed as well as Cologuard. ? ?He was seen in follow-up by Dr. Rennis Golden on 03/26/2020.  During that time he continued to do well.  He continued to work for [employer].  He plans to work for another couple of years.  He denied chest pain or shortness of breath.  He reported that he was experiencing tiredness during his work but was able to continue to be active.  He did not have an active PCP and was using [facility] for other health issues.  He reported a skin lesion on his right shoulder.  The lesion had been present for several months was raised and hard.  He reported that it did not change color. ? ?He presents to the clinic today for follow-up evaluation states he feels well.  He was sick over Christmas and feels that he had the flu.  He  remained sick for about 3 weeks.  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  He reports that you are able to do this without a CDL.  He has not had any further episodes of illness.  He plans to walk more with the weather being nice.  He is fasting today and we will repeat his fasting lipids and lab work.  His blood pressure is well controlled.  His EKG shows normal sinus rhythm with right bundle branch block which is not new.  I will give him the salty 6 diet sheet, have him increase his physical activity as tolerated, and plan follow-up in 12 months. ? ?Today he denies chest pain, shortness of breath, lower extremity edema, fatigue, palpitations, melena, hematuria, hemoptysis, diaphoresis, weakness, presyncope, syncope, orthopnea, and PND. ? ?Home Medications  ?  ?Prior to Admission medications   ?Medication Sig Start Date End Date Taking? Authorizing Provider  ?ASHWAGANDHA PO Take by mouth daily.    [provider]  ?aspirin 81 MG tablet Take 81 mg by mouth daily.     [provider]  ?atorvastatin (LIPITOR) 80 MG tablet KEEP OV. 04/17/21   Ronney Asters, NP  ?B Complex Vitamins (B COMPLEX PO) Take by mouth daily.  [provider]  ?calcium carbonate (OS-CAL) 600 MG TABS tablet Take 600 mg by mouth 2 (two) times daily with a meal.    [provider]  ?Chlorpheniramine Maleate (ALLERGY RELIEF PO) Take by mouth daily.    [provider]  ?FAMOTIDINE PO Take 1 tablet by mouth at bedtime.     [provider]  ?fish oil-omega-3 fatty acids 1000 MG capsule Take 1 g by mouth 2 (two) times daily.     [provider]  ?hydrochlorothiazide (HYDRODIURIL) 25 MG tablet Take 1 tablet (25 mg total) by mouth daily. 05/14/20   Chrystie Nose, MD  ?ibuprofen (ADVIL) 800 MG tablet Take 800 mg by mouth 3 (three) times daily. 01/06/20    [provider]  ?L-THEANINE PO Take by mouth daily.    [provider]  ?losartan (COZAAR) 50 MG tablet Take 1 tablet by mouth once daily 04/17/21   Hilty, Lisette Abu, MD  ?losartan (COZAAR) 50 MG tablet Take 1 tablet (50 mg total) by mouth daily. 04/16/21   Chrystie Nose, MD  ?MAGNESIUM PO Take by mouth daily.    [provider]  ?Multiple Vitamins-Minerals (MULTIVITAMIN WITH MINERALS) tablet Take 1 tablet by mouth daily.    [provider]  ?Multiple Vitamins-Minerals (VITAMIN D3 COMPLETE PO) Take 1 tablet by mouth daily.    [provider]  ?Potassium 99 MG TABS Take by mouth daily.    [provider]  ?ranitidine (ZANTAC) 150 MG capsule Take 150 mg by mouth daily.     [provider]  ?VALERIAN ROOT PO Take by mouth daily.    [provider]  ?Zinc 50 MG CAPS Take 1 capsule by mouth daily.    [provider]  ? ? ?Family History  ?  ?Family History  ?Problem Relation Age of Onset  ? Cancer Father   ? ?He indicated that his mother is deceased. He indicated that his father is deceased. ? ?Social History  ?  ?Social History  ? ?Socioeconomic History  ? Marital status: Married  ?  Spouse name: Not on file  ? Number of children: Not on file  ? Years of education: Not on file  ? Highest education level: Not on file  ?Occupational History  ? Not on file  ?Tobacco Use  ? Smoking status: Former  ?  Packs/day: 1.00  ?  Years: 2.00  ?  Pack years: 2.00  ?  Types: Cigarettes  ?  Quit date: 11/13/1982  ?  Years since quitting: 38.5  ? Smokeless tobacco: Never  ?Substance and Sexual Activity  ? Alcohol use: No  ? Drug use: No  ? Sexual activity: Not on file  ?Other Topics Concern  ? Not on file  ?Social History Narrative  ? Not on file  ? ?Social Determinants of Health  ? ?Financial Resource Strain: Not on file  ?Food Insecurity: Not on file  ?Transportation Needs: Not on file  ?Physical Activity: Not on file  ?Stress: Not on file  ?Social  Connections: Not on file  ?Intimate Partner Violence: Not on file  ?  ? ?Review of Systems  ?  ?General:  No chills, fever, night sweats or weight changes.  ?Cardiovascular:  No chest pain, dyspnea on exertion, edema, orthopnea, palpitations, paroxysmal nocturnal dyspnea. ?Dermatological: No rash, lesions/masses ?Respiratory: No cough, dyspnea ?Urologic: No hematuria, dysuria ?Abdominal:   No nausea, vomiting, diarrhea, bright red blood per rectum, melena, or hematemesis ?Neurologic:  No visual changes,  wkns, changes in mental status. ?All other systems reviewed and are otherwise negative except as noted above. ? ?Physical Exam  ?  ?VS:  BP 114/68 (BP Location: Left Arm, Patient Position: Sitting, Cuff Size: Normal)   Pulse 64   Ht 5\' 6"  (1.676 m)   Wt 181 lb 6.4 oz (82.3 kg)   BMI 29.28 kg/m?  , BMI Body mass index is 29.28 kg/m?. ?GEN: Well nourished, well developed, in no acute distress. ?HEENT: normal. ?Neck: Supple, no JVD, carotid bruits, or masses. ?Cardiac: RRR, no murmurs, rubs, or gallops. No clubbing, cyanosis, edema.  Radials/DP/PT 2+ and equal bilaterally.  ?Respiratory:  Respirations regular and unlabored, clear to auscultation bilaterally. ?GI: Soft, nontender, nondistended, BS + x 4. ?MS: no deformity or atrophy. ?Skin: warm and dry, no rash. ?Neuro:  Strength and sensation are intact. ?Psych: Normal affect. ? ?Accessory Clinical Findings  ?  ?Recent Labs: ?No results found for requested labs within last 8760 hours.  ? ?Recent Lipid Panel ?   ?Component Value Date/Time  ? CHOL 139 03/26/2020 0843  ? CHOL 126 12/02/2013 0920  ? TRIG 204 (H) 03/26/2020 0843  ? TRIG 157 (H) 12/02/2013 0920  ? HDL 36 (L) 03/26/2020 0843  ? HDL 33 (L) 12/02/2013 0920  ? CHOLHDL 3.9 03/26/2020 0843  ? CHOLHDL 4.2 01/25/2016 0915  ? VLDL 45 (H) 01/25/2016 0915  ? LDLCALC 69 03/26/2020 0843  ? LDLCALC 62 12/02/2013 0920  ? ? ?ECG personally reviewed by me today-normal sinus rhythm right bundle branch block 82 bpm- No  acute changes ? ?EKG 03/26/2020 ?Normal sinus rhythm RBBB 71 bpm ? ? ?Assessment & Plan  ? ?1.  Essential hypertension-BP today 114/68.  Well-controlled at home. ?Continue HCTZ, losartan ?Heart healthy low-sodium diet-sa

## 2021-05-29 ENCOUNTER — Ambulatory Visit: Payer: 59 | Admitting: General Practice

## 2021-05-29 ENCOUNTER — Encounter: Payer: Self-pay | Admitting: General Practice

## 2021-05-29 ENCOUNTER — Other Ambulatory Visit: Payer: Self-pay | Admitting: Internal Medicine

## 2021-05-29 VITALS — BP 114/68 | HR 64 | Ht 66.0 in | Wt 181.4 lb

## 2021-05-29 DIAGNOSIS — E785 Hyperlipidemia, unspecified: Secondary | ICD-10-CM

## 2021-05-29 DIAGNOSIS — I10 Essential (primary) hypertension: Secondary | ICD-10-CM | POA: Diagnosis not present

## 2021-05-29 DIAGNOSIS — I4510 Unspecified right bundle-branch block: Secondary | ICD-10-CM | POA: Diagnosis not present

## 2021-05-29 LAB — HEPATIC FUNCTION PANEL
ALT: 93 IU/L — ABNORMAL HIGH (ref 0–44)
AST: 77 IU/L — ABNORMAL HIGH (ref 0–40)
Albumin: 4.9 g/dL — ABNORMAL HIGH (ref 3.8–4.8)
Alkaline Phosphatase: 97 IU/L (ref 44–121)
Bilirubin Total: 0.7 mg/dL (ref 0.0–1.2)
Bilirubin, Direct: 0.19 mg/dL (ref 0.00–0.40)
Total Protein: 7.7 g/dL (ref 6.0–8.5)

## 2021-05-29 LAB — BASIC METABOLIC PANEL
BUN/Creatinine Ratio: 17 (ref 10–24)
BUN: 15 mg/dL (ref 8–27)
CO2: 26 mmol/L (ref 20–29)
Calcium: 10.1 mg/dL (ref 8.6–10.2)
Chloride: 100 mmol/L (ref 96–106)
Creatinine, Ser: 0.89 mg/dL (ref 0.76–1.27)
Glucose: 95 mg/dL (ref 70–99)
Potassium: 4.4 mmol/L (ref 3.5–5.2)
Sodium: 139 mmol/L (ref 134–144)
eGFR: 96 mL/min/{1.73_m2} (ref >59)

## 2021-05-29 LAB — CBC
Hematocrit: 44.2 % (ref 37.5–51.0)
Hemoglobin: 15.5 g/dL (ref 13.0–17.7)
MCH: 32.8 pg (ref 26.6–33.0)
MCHC: 35.1 g/dL (ref 31.5–35.7)
MCV: 93 fL (ref 79–97)
Platelets: 169 10*3/uL (ref 150–450)
RBC: 4.73 x10E6/uL (ref 4.14–5.80)
RDW: 12.4 % (ref 11.6–15.4)
WBC: 7.1 10*3/uL (ref 3.4–10.8)

## 2021-05-29 LAB — LIPID PANEL
Chol/HDL Ratio: 4.7 ratio (ref 0.0–5.0)
Cholesterol, Total: 135 mg/dL (ref 100–199)
HDL: 29 mg/dL — ABNORMAL LOW (ref >39)
LDL Chol Calc (NIH): 68 mg/dL (ref 0–99)
Triglycerides: 232 mg/dL — ABNORMAL HIGH (ref 0–149)
VLDL Cholesterol Cal: 38 mg/dL (ref 5–40)

## 2021-05-29 NOTE — Patient Instructions (Signed)
Medication Instructions:  ?The current medical regimen is effective;  continue present plan and medications as directed. Please refer to the Current Medication list given to you today.  ? ?*If you need a refill on your cardiac medications before your next appointment, please call your pharmacy* ? ?Lab Work:  ?FASTING LIPID, LFT, CBC AND BMET ? ?If you have labs (blood work) drawn today and your tests are completely normal, you will receive your results only by:  MyChart Message (if you have MyChart) OR A paper copy in the mail.  If you have any lab test that is abnormal or we need to change your treatment, we will call you to review the results. You may go to any [facility] that is convenient for you however, we do have a lab in our office that is able to assist you. You DO NOT need an appointment for our lab. The lab is open 8:00am and closes at 4:00pm. Lunch 12:45 - 1:45pm.       ? ?Special Instructions ?PLEASE READ AND FOLLOW SALTY 6-ATTACHED-1,800mg  daily ? ?PLEASE INCREASE PHYSICAL ACTIVITY AS TOLERATED  ? ?Follow-Up: ?Your next appointment:  12 month(s) In Person with Chrystie Nose, MD  ? ?Please call our office 2 months in advance to schedule this appointment  :1 ? ?At [facility], you and your health needs are our priority.  As part of our continuing mission to provide you with exceptional heart care, we have created designated Provider Care Teams.  These Care Teams include your primary Cardiologist (physician) and Advanced Practice Providers (APPs -  Physician Assistants and Nurse Practitioners) who all work together to provide you with the care you need, when you need it. ? ? ? ?        6 SALTY THINGS TO AVOID     1,800MG  DAILY ? ? ? ? ?  ?

## 2021-05-30 ENCOUNTER — Other Ambulatory Visit: Payer: Self-pay

## 2021-05-30 DIAGNOSIS — E785 Hyperlipidemia, unspecified: Secondary | ICD-10-CM

## 2021-05-30 DIAGNOSIS — Z79899 Other long term (current) drug therapy: Secondary | ICD-10-CM

## 2021-05-30 DIAGNOSIS — I10 Essential (primary) hypertension: Secondary | ICD-10-CM

## 2021-07-03 ENCOUNTER — Telehealth: Payer: Self-pay | Admitting: Internal Medicine

## 2021-07-03 NOTE — Telephone Encounter (Signed)
RN called  [facility]- spoke to [employer] Designer, fashion/clothing)  ?Patient did not sign a [employer] form - for  information to be  released ? ? Unable  to obtain any information until  completed.  ? ?  ?

## 2021-07-03 NOTE — Telephone Encounter (Signed)
Wife is calling. She states the patient  was trying to be candidate for a upcoming Research trial  ( [facility]  )  testing a new blood pressure  medication. ? ?Patient had an appointment today. ? Patient was decline from the study -   per wife one of the reason was elevated liver enzymes. ? ? Wife was wondering if patient should  stop taking Atorvastatin al together.  Per wife, patient followed instructions and stopped  taking Atorvastatin for one week  ?Restarted Atorvastatin 80 mg  and not 40 mg . ?  ? ? Rn informed  wife patient should be taking 40 mg daily.  RN will contact [facility]  [phone])to obtain labs that may have been done recently. ? ?

## 2021-07-03 NOTE — Telephone Encounter (Signed)
Alver Sorrow, NP  ?05/30/2021  7:39 AM EDT   ?  ?Liver enzymes elevated compared to one year ago. Recommend avoidance of fried foods, alcohol, acetaminophen (tylenol). CBC with no evidence of anemia nor infection.  Normal kidneys, electrolytes. Cholesterol panel shows triglycerides elevated. Ensure reduce intake of carbs, sugars, sweets. ?  ?Elevated liver enzymes could be related to cholesterol medication. Recommend hold for 1 week then resume at Atorvastatin 40mg  daily. Repeat FLP/LFT in 8 weeks.  ? ?

## 2021-07-03 NOTE — Telephone Encounter (Signed)
RN called wife and informed her of the situation.  Wife is aware to contact office  when [place] form is completed   ? Office was attempting to obtain nay recent labs t since last set labs were done since 05/29/21. ? She voiced understanding. ? RN will send  message to C. Walker NP and J.Cleaver NP ?

## 2021-07-03 NOTE — Telephone Encounter (Signed)
Pt c/o medication issue: ? ?1. Name of Medication:  ? atorvastatin (LIPITOR) 80 MG tablet  ? ? ?2. How are you currently taking this medication (dosage and times per day)? KEEP OV. ? ?3. Are you having a reaction (difficulty breathing--STAT)? No ? ?4. What is your medication issue? Pt would like to know if he needs to stop taking medication. Pt would also like to know if he needs to do any further labs. Please advise ? ?

## 2021-07-30 ENCOUNTER — Other Ambulatory Visit: Payer: Self-pay | Admitting: *Deleted

## 2021-07-30 ENCOUNTER — Other Ambulatory Visit: Payer: Self-pay | Admitting: General Practice

## 2021-07-30 DIAGNOSIS — E785 Hyperlipidemia, unspecified: Secondary | ICD-10-CM

## 2021-07-30 DIAGNOSIS — I10 Essential (primary) hypertension: Secondary | ICD-10-CM

## 2021-07-30 DIAGNOSIS — Z79899 Other long term (current) drug therapy: Secondary | ICD-10-CM

## 2021-07-30 LAB — HEPATIC FUNCTION PANEL
ALT: 92 IU/L — ABNORMAL HIGH (ref 0–44)
AST: 69 IU/L — ABNORMAL HIGH (ref 0–40)
Albumin: 4.7 g/dL (ref 3.8–4.8)
Alkaline Phosphatase: 133 IU/L — ABNORMAL HIGH (ref 44–121)
Bilirubin Total: 0.4 mg/dL (ref 0.0–1.2)
Bilirubin, Direct: 0.14 mg/dL (ref 0.00–0.40)
Total Protein: 7.9 g/dL (ref 6.0–8.5)

## 2021-07-30 LAB — LIPID PANEL
Chol/HDL Ratio: 5.7 ratio — ABNORMAL HIGH (ref 0.0–5.0)
Cholesterol, Total: 148 mg/dL (ref 100–199)
HDL: 26 mg/dL — ABNORMAL LOW (ref >39)
LDL Chol Calc (NIH): 67 mg/dL (ref 0–99)
Triglycerides: 347 mg/dL — ABNORMAL HIGH (ref 0–149)
VLDL Cholesterol Cal: 55 mg/dL — ABNORMAL HIGH (ref 5–40)

## 2021-07-31 ENCOUNTER — Other Ambulatory Visit: Payer: Self-pay

## 2021-07-31 DIAGNOSIS — E785 Hyperlipidemia, unspecified: Secondary | ICD-10-CM

## 2021-07-31 DIAGNOSIS — Z79899 Other long term (current) drug therapy: Secondary | ICD-10-CM

## 2021-07-31 MED ORDER — FENOFIBRATE 120 MG PO TABS: 145.0000 mg | ORAL_TABLET | Freq: Every day | ORAL | 6 refills | Status: DC

## 2021-07-31 MED ORDER — ATORVASTATIN CALCIUM 40 MG PO TABS: 40.0000 mg | ORAL_TABLET | Freq: Every day | ORAL | 3 refills | Status: DC

## 2021-08-05 ENCOUNTER — Telehealth: Payer: Self-pay | Admitting: Internal Medicine

## 2021-08-05 MED ORDER — FENOFIBRATE 120 MG PO TABS: 120.0000 mg | ORAL_TABLET | Freq: Every day | ORAL | 11 refills | Status: DC

## 2021-08-05 NOTE — Telephone Encounter (Signed)
Pt c/o medication issue:  1. Name of Medication:   fenofibrate 120 MG TABS    2. How are you currently taking this medication (dosage and times per day)?  Take 1.2083 tablets (145 mg total) by mouth daily. 3. Are you having a reaction (difficulty breathing--STAT)? No  4. What is your medication issue? Pharmacy wanting to know if it is okay to change dosage to 145 mg tablet being that, that's what in the instructions. Please advise

## 2021-08-05 NOTE — Telephone Encounter (Signed)
Douglas Asters, NP  07/31/2021  6:08 AM EDT     Please contact [name] and let him know that his cholesterol panel has been reviewed.  His LDL was noted to be 67 which is slightly improved from his previous drawn.  His triglycerides were elevated at 347.  I will order fenofibrate 120 mg daily.  His liver enzymes are elevated but slightly better than they were during his previous drawl in April. I will reduce his atorvastatin to 40 mg daily.  We will recheck his fasting lipids and LFTs in 8 weeks.  Please refer him to the pharmacy lipid clinic.  Thank you.    Notified pharmacy that based on note the medication should be, "I will order fenofibrate 120 mg daily." I sent in updated prescription. Pharmacist verbalized understanding.

## 2021-08-06 ENCOUNTER — Telehealth: Payer: Self-pay

## 2021-08-06 ENCOUNTER — Other Ambulatory Visit: Payer: Self-pay

## 2021-08-06 DIAGNOSIS — E785 Hyperlipidemia, unspecified: Secondary | ICD-10-CM

## 2021-08-06 MED ORDER — FENOFIBRATE 145 MG PO TABS: 145.0000 mg | ORAL_TABLET | Freq: Every day | ORAL | 3 refills | Status: DC

## 2021-08-06 NOTE — Telephone Encounter (Signed)
Spoke with Zella Ball at [facility] pharmacy to give prescription order for fenofibrate 145 mg orally daily per J. Molli Hazard, FNP.

## 2021-08-06 NOTE — Telephone Encounter (Signed)
Received call from Yonah at [facility] [phone]. She is asking for fenofibrate 145 mg tablet instead of 120 mg tablet due to insurance coverage. Should I put in the order for fenofibrate 145 mg daily?

## 2021-08-15 ENCOUNTER — Ambulatory Visit: Payer: 59 | Admitting: Pharmacist Clinician (PhC)/ Clinical Pharmacy Specialist

## 2021-08-15 DIAGNOSIS — E782 Mixed hyperlipidemia: Secondary | ICD-10-CM | POA: Diagnosis not present

## 2021-08-15 NOTE — Progress Notes (Unsigned)
08/15/2021 Douglas Mcguire 1955-03-26 [id number]   HPI:  Douglas Mcguire is a 66 y.o. male patient of Dr ***, who presents today for a lipid clinic evaluation.  See pertinent past medical history below.  Atorvastatin at 42 felt less bloated   Past Medical History:                    Current Medications:  Cholesterol Goals:   Intolerant/previously tried:  atorvastatin 80 mg (felt bloated, stopped 2 weeks ago)  Family history: father died from liver toxicity 2/2 apap; mother had heart disease in her family, died from vascular dementia; one sister now 45 fairly healthy  Diet: since retired doesn't eat out as much; wife cooks - Information systems manager and fish, no pork; not as much veggies as he should; steamed fresh; likes salad; not much for snacking  Exercise:  no regular exercise; does mow lawn weekly  Labs:  07/30/21: TC 148, TG 347, HDL 26, LDL 67 (started fenofibrate recently)   Current Outpatient Medications  Medication Sig Dispense Refill   ASHWAGANDHA PO Take by mouth daily.     aspirin 81 MG tablet Take 81 mg by mouth daily.      B Complex Vitamins (B COMPLEX PO) Take by mouth daily.     calcium carbonate (OS-CAL) 600 MG TABS tablet Take 600 mg by mouth 2 (two) times daily with a meal.     Chlorpheniramine Maleate (ALLERGY RELIEF PO) Take by mouth daily.     FAMOTIDINE PO Take 1 tablet by mouth at bedtime.      fenofibrate (TRICOR) 145 MG tablet Take 1 tablet (145 mg total) by mouth daily. 90 tablet 3   fish oil-omega-3 fatty acids 1000 MG capsule Take 1 g by mouth 2 (two) times daily.      hydrochlorothiazide (HYDRODIURIL) 25 MG tablet Take 1 tablet by mouth once daily 90 tablet 0   ibuprofen (ADVIL) 800 MG tablet Take 800 mg by mouth 3 (three) times daily.     L-THEANINE PO Take by mouth daily.     losartan (COZAAR) 50 MG tablet Take 1 tablet by mouth once daily 60 tablet 0   losartan (COZAAR) 50 MG tablet Take 1 tablet (50 mg total) by mouth daily. 90 tablet 1    MAGNESIUM PO Take by mouth daily.     Multiple Vitamins-Minerals (MULTIVITAMIN WITH MINERALS) tablet Take 1 tablet by mouth daily.     Multiple Vitamins-Minerals (VITAMIN D3 COMPLETE PO) Take 1 tablet by mouth daily.     Potassium 99 MG TABS Take by mouth daily.     ranitidine (ZANTAC) 150 MG capsule Take 150 mg by mouth daily.      VALERIAN ROOT PO Take by mouth daily.     Zinc 50 MG CAPS Take 1 capsule by mouth daily.     No current facility-administered medications for this visit.    No Known Allergies  Past Medical History:  Diagnosis Date   Allergy    Dyslipidemia    Dysrhythmia    Epilepsy (HCC)    GERD (gastroesophageal reflux disease)    Hepatitis    Hypertension     There were no vitals taken for this visit.   No problem-specific Assessment & Plan notes found for this encounter.   Phillips Hay PharmD CPP [facility] [address] [place], [place] [address] [phone]

## 2021-08-16 ENCOUNTER — Encounter: Payer: Self-pay | Admitting: Pharmacist Clinician (PhC)/ Clinical Pharmacy Specialist

## 2021-08-16 MED ORDER — ROSUVASTATIN CALCIUM 20 MG PO TABS: 20.0000 mg | ORAL_TABLET | Freq: Every day | ORAL | 3 refills | Status: AC

## 2021-08-18 ENCOUNTER — Other Ambulatory Visit: Payer: Self-pay | Admitting: Internal Medicine

## 2021-10-07 ENCOUNTER — Encounter: Payer: Self-pay | Admitting: Pharmacist Clinician (PhC)/ Clinical Pharmacy Specialist

## 2021-10-17 ENCOUNTER — Other Ambulatory Visit: Payer: Self-pay

## 2021-10-17 ENCOUNTER — Other Ambulatory Visit: Payer: Self-pay | Admitting: *Deleted

## 2021-10-17 DIAGNOSIS — Z79899 Other long term (current) drug therapy: Secondary | ICD-10-CM

## 2021-10-17 MED ORDER — LOSARTAN POTASSIUM 50 MG PO TABS: 50.0000 mg | ORAL_TABLET | Freq: Every day | ORAL | 1 refills | Status: DC

## 2021-10-18 LAB — LIPID PANEL
Chol/HDL Ratio: 5.3 ratio — ABNORMAL HIGH (ref 0.0–5.0)
Cholesterol, Total: 153 mg/dL (ref 100–199)
HDL: 29 mg/dL — ABNORMAL LOW (ref >39)
LDL Chol Calc (NIH): 82 mg/dL (ref 0–99)
Triglycerides: 253 mg/dL — ABNORMAL HIGH (ref 0–149)
VLDL Cholesterol Cal: 42 mg/dL — ABNORMAL HIGH (ref 5–40)

## 2021-10-18 LAB — HEPATIC FUNCTION PANEL
ALT: 49 IU/L — ABNORMAL HIGH (ref 0–44)
AST: 56 IU/L — ABNORMAL HIGH (ref 0–40)
Albumin: 4.8 g/dL (ref 3.9–4.9)
Alkaline Phosphatase: 51 IU/L (ref 44–121)
Bilirubin Total: 0.5 mg/dL (ref 0.0–1.2)
Bilirubin, Direct: 0.13 mg/dL (ref 0.00–0.40)
Total Protein: 7.5 g/dL (ref 6.0–8.5)

## 2021-11-12 ENCOUNTER — Other Ambulatory Visit: Payer: Self-pay

## 2021-11-12 MED ORDER — LOSARTAN POTASSIUM 50 MG PO TABS: 50.0000 mg | ORAL_TABLET | Freq: Every day | ORAL | 3 refills | Status: DC

## 2021-11-14 ENCOUNTER — Other Ambulatory Visit: Payer: Self-pay

## 2021-11-14 DIAGNOSIS — E785 Hyperlipidemia, unspecified: Secondary | ICD-10-CM

## 2021-11-14 MED ORDER — FENOFIBRATE 145 MG PO TABS: 145.0000 mg | ORAL_TABLET | Freq: Every day | ORAL | 3 refills | Status: DC

## 2022-02-13 LAB — COLOGUARD: COLOGUARD: NEGATIVE

## 2022-04-02 ENCOUNTER — Ambulatory Visit: Payer: Self-pay | Admitting: Allergy and Immunology

## 2022-05-05 ENCOUNTER — Ambulatory Visit: Payer: Self-pay | Admitting: Allergy and Immunology

## 2022-06-30 DIAGNOSIS — R748 Abnormal levels of other serum enzymes: Secondary | ICD-10-CM | POA: Diagnosis not present

## 2022-06-30 DIAGNOSIS — K219 Gastro-esophageal reflux disease without esophagitis: Secondary | ICD-10-CM | POA: Diagnosis not present

## 2022-06-30 DIAGNOSIS — Z5320 Procedure and treatment not carried out because of patient's decision for unspecified reasons: Secondary | ICD-10-CM | POA: Diagnosis not present

## 2022-07-07 DIAGNOSIS — H2513 Age-related nuclear cataract, bilateral: Secondary | ICD-10-CM | POA: Diagnosis not present

## 2022-08-07 ENCOUNTER — Encounter: Payer: Self-pay | Admitting: Internal Medicine

## 2022-08-11 DIAGNOSIS — K219 Gastro-esophageal reflux disease without esophagitis: Secondary | ICD-10-CM | POA: Diagnosis not present

## 2022-08-11 DIAGNOSIS — R748 Abnormal levels of other serum enzymes: Secondary | ICD-10-CM | POA: Diagnosis not present

## 2022-09-03 ENCOUNTER — Other Ambulatory Visit: Payer: Self-pay | Admitting: Internal Medicine

## 2022-11-03 DIAGNOSIS — R748 Abnormal levels of other serum enzymes: Secondary | ICD-10-CM | POA: Diagnosis not present

## 2022-11-10 DIAGNOSIS — K219 Gastro-esophageal reflux disease without esophagitis: Secondary | ICD-10-CM | POA: Diagnosis not present

## 2022-11-10 DIAGNOSIS — R748 Abnormal levels of other serum enzymes: Secondary | ICD-10-CM | POA: Diagnosis not present

## 2022-11-10 DIAGNOSIS — Z5320 Procedure and treatment not carried out because of patient's decision for unspecified reasons: Secondary | ICD-10-CM | POA: Diagnosis not present

## 2022-11-17 DIAGNOSIS — R7989 Other specified abnormal findings of blood chemistry: Secondary | ICD-10-CM | POA: Diagnosis not present

## 2022-12-29 ENCOUNTER — Ambulatory Visit: Payer: Medicare Other | Attending: Internal Medicine | Admitting: Internal Medicine

## 2022-12-29 ENCOUNTER — Encounter: Payer: Self-pay | Admitting: Internal Medicine

## 2022-12-29 VITALS — BP 120/90 | HR 76 | Ht 66.0 in | Wt 197.0 lb

## 2022-12-29 DIAGNOSIS — I10 Essential (primary) hypertension: Secondary | ICD-10-CM

## 2022-12-29 DIAGNOSIS — I4510 Unspecified right bundle-branch block: Secondary | ICD-10-CM

## 2022-12-29 DIAGNOSIS — E782 Mixed hyperlipidemia: Secondary | ICD-10-CM

## 2022-12-29 NOTE — Progress Notes (Unsigned)
OFFICE NOTE  Chief Complaint:  Annual follow-up  Primary Care Physician: Julianne Handler, NP  HPI:  Douglas Mcguire is a 67 year old gentleman who I see annually for hypertension which has been well controlled, as well as dyslipidemia. 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 He had managed to lose some weight, however, has gained some of that back. His blood pressures remain stable. He does not do any specific exercise but is active. He denies any chest pain, shortness of breath, palpitations, presyncope, syncopal symptoms.   Douglas Mcguire returns today for followup. He reports feeling very well. Denies any chest pain or shortness of breath. He reports he started eating a healthier diet and he seemed a little bit of weight loss. He is taking atorvastatin 80 mg now and is due for repeat test of his cholesterol. Blood pressures been well controlled. He reports a history of fatty liver the past and we may need to survey his liver enzymes.  I had the pleasure of seeing Douglas Mcguire back in the office today for follow-up. Overall he is doing well denies any chest pain or shortness of breath. Occasionally he sees some black dots, which sound like maybe floaters. He has had good control of his blood pressure. She's on Lipitor 80 and had a particle number less than 1000 at his last office visit. He is due for repeat blood work.  01/25/2016  Douglas Mcguire returns today for follow-up. He has been asymptomatic over the past year. He denies chest pain or worsening shortness of breath. He is due for refills of medication. He does not have a PCP. He has not had a screening colonoscopy or any routine bloodwork. He has a stable RBBB which was new last year, but has been asymptomatic with this.  02/06/2017  Douglas Mcguire was seen today in follow-up.  This is his annual visit.  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  He works for a company that is up and down the [employer].  He denies any chest pain or worsening shortness of breath with this activity.  He is still not settled with a PCP.  I had referred him for a screening colonoscopy but he I did not make that appointment.  He was asking about the possibility of doing Cologuard testing.  I said it would be best for him to establish with a PCP with regards to that as we do not carry the kids.  He is fasting today and interested in doing annual blood work.  EKG shows sinus rhythm with stable right bundle branch block.  03/19/2018  Douglas Mcguire is seen today in annual follow-up.  Last year was unfortunately eventful for him.  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  Heart wise he denies any chest pain or shortness of breath.  He is active, but does not exercise.  He has not had any lab work or seen a primary care provider.  He primarily gets medications through [place].  I again discussed possibility screening colonoscopy with him however he refused it and now is again interested in the Cologuard.  He will be due for repeat lab work and refills of his prescriptions.  03/26/2020  Douglas Mcguire is seen today for follow-up.  I last saw him via virtual visit a year  ago.  Since then he says he has been doing well.  He continues to work for [employer].  He says he plans to work for another couple years.  He denies any chest pain or shortness of breath.  He says he does get tired during some of his work but is able to continue to be active.  He is due for repeat lab work.  His last lab work was about a year ago.  Does not have an active primary care provider but uses an urgent care and [place] for some issues.  He also pointed out a skin lesion on his right shoulder to me today.  He says this is been noted for several months.  It is raised and hard.  He says it is not changed  colors.  12/29/2022  Douglas Mcguire returns today for follow-up.  He says he is overall retired at this point but is doing some delivery work on the side.  He has established with a primary care provider in [place].  He denies any chest pain or shortness of breath.  His blood pressure is better controlled however the diastolic was a little high today at 90.  EKG shows sinus rhythm with a right bundle branch block that was previously noted.  He has not had any recent lipid testing since 2023.  PMHx:  Past Medical History:  Diagnosis Date   Allergy    Dyslipidemia    Dysrhythmia    Epilepsy (HCC)    GERD (gastroesophageal reflux disease)    Hepatitis    Hypertension     No past surgical history on file.  FAMHx:  Family History  Problem Relation Age of Onset   Cancer Father     SOCHx:   reports that he quit smoking about 40 years ago. His smoking use included cigarettes. He started smoking about 42 years ago. He has a 2 pack-year smoking history. He has never used smokeless tobacco. He reports that he does not drink alcohol and does not use drugs.  ALLERGIES:  No Known Allergies  ROS: Pertinent items noted in HPI and remainder of comprehensive ROS otherwise negative.  HOME MEDS: Current Outpatient Medications  Medication Sig Dispense Refill   aspirin 81 MG tablet Take 81 mg by mouth daily.      calcium carbonate (OS-CAL) 600 MG TABS tablet Take 600 mg by mouth 2 (two) times daily with a meal.     FAMOTIDINE PO Take 1 tablet by mouth at bedtime.      fish oil-omega-3 fatty acids 1000 MG capsule Take 1 g by mouth 2 (two) times daily.      hydrochlorothiazide (HYDRODIURIL) 25 MG tablet Take 1 tablet (25 mg total) by mouth daily. 90 tablet 2   ibuprofen (ADVIL) 800 MG tablet Take 800 mg by mouth 3 (three) times daily.     losartan (COZAAR) 50 MG tablet Take 1 tablet by mouth once daily 90 tablet 1   MAGNESIUM PO Take by mouth daily.     Multiple Vitamins-Minerals (VITAMIN D3  COMPLETE PO) Take 1 tablet by mouth daily.     Potassium 99 MG TABS Take by mouth daily.     rosuvastatin (CRESTOR) 20 MG tablet Take 1 tablet (20 mg total) by mouth daily. 90 tablet 3   VALERIAN ROOT PO Take by mouth daily.     Zinc 50 MG CAPS Take 1 capsule by mouth daily.     fenofibrate (TRICOR) 145 MG tablet Take 1 tablet (  145 mg total) by mouth daily. 90 tablet 3   No current facility-administered medications for this visit.    LABS/IMAGING: No results found for this or any previous visit (from the past 48 hour(s)). No results found.  VITALS: BP (!) 120/90   Pulse 76   Ht 5\' 6"  (1.676 m)   Wt 197 lb (89.4 kg)   SpO2 98%   BMI 31.80 kg/m   EXAM: General appearance: alert and no distress Neck: no carotid bruit, no JVD and thyroid not enlarged, symmetric, no tenderness/mass/nodules Lungs: clear to auscultation bilaterally Heart: regular rate and rhythm, S1, S2 normal, no murmur, click, rub or gallop Abdomen: soft, non-tender; bowel sounds normal; no masses,  no organomegaly Extremities: extremities normal, atraumatic, no cyanosis or edema Pulses: 2+ and symmetric Skin: Skin color, texture, turgor normal. No rashes or lesions or 3 mm raised, cornified light-colored lesion on the right shoulder Neurologic: Grossly normal Psych: Pleasant  EKG: EKG Interpretation Date/Time:  Monday December 29 2022 10:22:06 EST Ventricular Rate:  76 PR Interval:  152 QRS Duration:  140 QT Interval:  406 QTC Calculation: 456 R Axis:   20  Text Interpretation: Normal sinus rhythm Right bundle branch block When compared with ECG of 29-Sep-2007 05:51, Right bundle branch block was previously noted Reconfirmed by Zoila Shutter [phone]) on 12/31/2022 9:25:15 AM    ASSESSMENT: Hypertension-controlled Dyslipidemia Right bundle branch block  PLAN: 1.   Douglas Mcguire seems to be doing well denies chest pain or shortness of breath.  He tablets with the primary care provider in [place].  He  will have lipid testing done there.  His blood pressure is generally well-controlled.  He has a stable right bundle branch block.  No chest pain or worsening shortness of breath.  Follow-up with me annually or sooner as necessary.  Chrystie Nose, MD, [facility], FACP  [place]  [facility]  Medical Director of the [facility] Lipid [facility] &  Cardiovascular Risk Reduction Clinic Attending Cardiologist  Direct Dial: [phone]  Fax: [phone]  Website:  [email or web address] 12/31/2022, 9:25 AM

## 2022-12-29 NOTE — Patient Instructions (Signed)
Medication Instructions:  NO CHANGES  *If you need a refill on your cardiac medications before your next appointment, please call your pharmacy*   Follow-Up: At [facility], you and your health needs are our priority.  As part of our continuing mission to provide you with exceptional heart care, we have created designated Provider Care Teams.  These Care Teams include your primary Cardiologist (physician) and Advanced Practice Providers (APPs -  Physician Assistants and Nurse Practitioners) who all work together to provide you with the care you need, when you need it.  We recommend signing up for the patient portal called "MyChart".  Sign up information is provided on this After Visit Summary.  MyChart is used to connect with patients for Virtual Visits (Telemedicine).  Patients are able to view lab/test results, encounter notes, upcoming appointments, etc.  Non-urgent messages can be sent to your provider as well.   To learn more about what you can do with MyChart, go to [email or web address].    Your next appointment:    12 months with Dr. Hilty  

## 2023-01-16 DIAGNOSIS — Z79899 Other long term (current) drug therapy: Secondary | ICD-10-CM | POA: Diagnosis not present

## 2023-01-16 DIAGNOSIS — Z131 Encounter for screening for diabetes mellitus: Secondary | ICD-10-CM | POA: Diagnosis not present

## 2023-01-16 DIAGNOSIS — I4510 Unspecified right bundle-branch block: Secondary | ICD-10-CM | POA: Diagnosis not present

## 2023-01-16 DIAGNOSIS — I10 Essential (primary) hypertension: Secondary | ICD-10-CM | POA: Diagnosis not present

## 2023-01-16 DIAGNOSIS — K7581 Nonalcoholic steatohepatitis (NASH): Secondary | ICD-10-CM | POA: Diagnosis not present

## 2023-01-16 DIAGNOSIS — E782 Mixed hyperlipidemia: Secondary | ICD-10-CM | POA: Diagnosis not present

## 2023-01-16 DIAGNOSIS — Z91018 Allergy to other foods: Secondary | ICD-10-CM | POA: Diagnosis not present

## 2023-01-16 DIAGNOSIS — K7460 Unspecified cirrhosis of liver: Secondary | ICD-10-CM | POA: Diagnosis not present

## 2023-02-15 ENCOUNTER — Other Ambulatory Visit: Payer: Self-pay | Admitting: Oncology

## 2023-02-15 DIAGNOSIS — D508 Other iron deficiency anemias: Secondary | ICD-10-CM

## 2023-02-15 NOTE — Progress Notes (Signed)
[facility]  [address] [place],  [place]  [address] [phone]  Clinic Day:  02/16/2023  Referring physician: Julianne Handler, NP  HISTORY OF PRESENT ILLNESS:  The patient is a 67 y.o. male  who I was asked to consult upon for microcytic anemia.  Recent labs showed a low hemoglobin of 10.6, with a low MCV of 75.  The patient was first told he was anemic a few months ago when his hemoglobin was too low to donate blood.  He claims to have a remote history of bleeding hemorrhoids.  He has never undergone a colonoscopy, but claims recent Cologuard testing came back negative.  He claims to have occasional black stools.  He takes a baby aspirin nightly and has been taking 1 ibuprofen in the morning for numerous years.   To her knowledge, there is no family history of anemia or other hematologic disorders.  PAST MEDICAL HISTORY:   Past Medical History:  Diagnosis Date   Allergy    Dyslipidemia    Dysrhythmia    Epilepsy (HCC)    GERD (gastroesophageal reflux disease)    Hepatitis    Hypertension     PAST SURGICAL HISTORY:  No past surgical history on file.  CURRENT MEDICATIONS:   Current Outpatient Medications  Medication Sig Dispense Refill   Coenzyme Q10 (COQ10) 30 MG CAPS      fenofibrate micronized (LOFIBRA) 134 MG capsule      [place] [employer] 565 MG CAPS      Misc Natural Products (BEET ROOT PO)      pantoprazole (PROTONIX) 40 MG tablet Take 40 mg by mouth daily.     aspirin 81 MG tablet Take 81 mg by mouth daily.      calcium carbonate (OS-CAL) 600 MG TABS tablet Take 600 mg by mouth 2 (two) times daily with a meal.     FAMOTIDINE PO Take 1 tablet by mouth at bedtime.      fish oil-omega-3 fatty acids 1000 MG capsule Take 1 g by mouth 2 (two) times daily.      hydrochlorothiazide (HYDRODIURIL) 25 MG tablet Take 1 tablet (25 mg total) by mouth daily. 90 tablet 2   ibuprofen (ADVIL) 800 MG tablet Take 800 mg by mouth 3 (three) times  daily.     losartan (COZAAR) 50 MG tablet Take 1 tablet by mouth once daily 90 tablet 1   MAGNESIUM PO Take by mouth daily.     Multiple Vitamins-Minerals (VITAMIN D3 COMPLETE PO) Take 1 tablet by mouth daily.     Potassium 99 MG TABS Take by mouth daily.     rosuvastatin (CRESTOR) 20 MG tablet Take 1 tablet (20 mg total) by mouth daily. 90 tablet 3   VALERIAN ROOT PO Take by mouth daily.     Zinc 50 MG CAPS Take 1 capsule by mouth daily.     No current facility-administered medications for this visit.    ALLERGIES:   Allergies  Allergen Reactions   Naphazoline-Glycerin-Zinc Sulf Cough    FAMILY HISTORY:   Family History  Problem Relation Age of Onset   Dementia Mother    Transient ischemic attack Mother    Cirrhosis Father        NASH   Heart disease Father     SOCIAL HISTORY:  The patient was born and raised in [place].  He lives in [place] with his wife of 32 years.  He has no children.  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  He smoked and drank briefly, but has not done so in 40+ years.    REVIEW OF SYSTEMS:  Review of Systems  Constitutional:  Positive for fatigue and unexpected weight change. Negative for fever.  Respiratory:  Negative for chest tightness, cough, hemoptysis and shortness of breath.   Cardiovascular:  Negative for chest pain and palpitations.  Gastrointestinal:  Negative for abdominal distention, abdominal pain, blood in stool, constipation, diarrhea, nausea and vomiting.  Genitourinary:  Negative for dysuria, frequency and hematuria.   Musculoskeletal:  Negative for arthralgias, back pain and myalgias.  Skin:  Negative for itching and rash.  Neurological:  Negative for dizziness, headaches and light-headedness.  Psychiatric/Behavioral:  Negative for depression and suicidal ideas. The patient is not nervous/anxious.     PHYSICAL EXAM:  Blood pressure (!) 131/96, pulse 70, temperature 98.1 F (36.7 C), temperature source Oral, resp. rate 16,  height 5\' 6"  (1.676 m), weight 190 lb 4.8 oz (86.3 kg), SpO2 99%. Wt Readings from Last 3 Encounters:  02/16/23 190 lb 4.8 oz (86.3 kg)  12/29/22 197 lb (89.4 kg)  05/29/21 181 lb 6.4 oz (82.3 kg)   Body mass index is 30.72 kg/m. Performance status (ECOG): 1 - Symptomatic but completely ambulatory Physical Exam Constitutional:      Appearance: Normal appearance. He is not ill-appearing.  HENT:     Mouth/Throat:     Mouth: Mucous membranes are moist.     Pharynx: Oropharynx is clear. No oropharyngeal exudate or posterior oropharyngeal erythema.  Cardiovascular:     Rate and Rhythm: Normal rate and regular rhythm.     Heart sounds: No murmur heard.    No friction rub. No gallop.  Pulmonary:     Effort: Pulmonary effort is normal. No respiratory distress.     Breath sounds: Normal breath sounds. No wheezing, rhonchi or rales.  Abdominal:     General: Bowel sounds are normal. There is no distension.     Palpations: Abdomen is soft. There is no mass.     Tenderness: There is no abdominal tenderness.  Musculoskeletal:        General: No swelling.     Right lower leg: No edema.     Left lower leg: No edema.  Lymphadenopathy:     Cervical: No cervical adenopathy.     Upper Body:     Right upper body: No supraclavicular or axillary adenopathy.     Left upper body: No supraclavicular or axillary adenopathy.     Lower Body: No right inguinal adenopathy. No left inguinal adenopathy.  Skin:    General: Skin is warm.     Coloration: Skin is not jaundiced.     Findings: No lesion or rash.  Neurological:     General: No focal deficit present.     Mental Status: He is alert and oriented to person, place, and time. Mental status is at baseline.  Psychiatric:        Mood and Affect: Mood normal.        Behavior: Behavior normal.        Thought Content: Thought content normal.    LABS:      Latest Ref Rng & Units 02/16/2023    8:17 AM 05/29/2021    9:56 AM 03/26/2020    8:43 AM  CBC   WBC 4.0 - 10.5 K/uL 6.8  7.1  7.1   Hemoglobin 13.0 - 17.0 g/dL 10.9  60.4  54.0   Hematocrit  39.0 - 52.0 % 34.9  44.2  46.3   Platelets 150 - 400 K/uL 208  169  182       Latest Ref Rng & Units 02/16/2023    8:17 AM 10/17/2021    8:51 AM 07/30/2021    9:14 AM  CMP  Glucose 70 - 99 mg/dL 161     BUN 8 - 23 mg/dL 21     Creatinine 0.96 - 1.24 mg/dL 0.45     Sodium 409 - 811 mmol/L 139     Potassium 3.5 - 5.1 mmol/L 4.1     Chloride 98 - 111 mmol/L 102     CO2 22 - 32 mmol/L 25     Calcium 8.9 - 10.3 mg/dL 9.8     Total Protein 6.5 - 8.1 g/dL 8.0  7.5  7.9   Total Bilirubin <1.2 mg/dL 0.4  0.5  0.4   Alkaline Phos 38 - 126 U/L 70  51  133   AST 15 - 41 U/L 64  56  69   ALT 0 - 44 U/L 51  49  92     Latest Reference Range & Units 02/16/23 08:18  Iron 45 - 182 ug/dL 46  UIBC ug/dL 914  TIBC 782 - 956 ug/dL 213 (H)  Saturation Ratios 17.9 - 39.5 % 7 (L)  Ferritin 24 - 336 ng/mL 10 (L)  (H): Data is abnormally high (L): Data is abnormally low  ASSESSMENT & PLAN:  A 67 y.o. male whose labs a clearly consistent with iron deficiency anemia.  I will arrange for him to receive IV iron over these next few weeks to rapidly replenish his iron stores and normalize his hemoglobin.  As he has never undergone a formal colonoscopy, I will arrange for him to see GI to have this done.  I also have told him to stop taking ibuprofen unless it is absolutely necessary.   As mentioned previously, he has taken 1 ibuprofen daily in the morning for numerous years.  Otherwise, I will see him back in 3 months to reassess his iron and hemoglobin levels to see how well he responded to his upcoming IV iron.  The patient understands all the plans discussed today and is in agreement with them.  I do appreciate Julianne Handler, NP for his new consult.   Cleatis Fandrich Kirby Funk, MD

## 2023-02-16 ENCOUNTER — Encounter: Payer: Self-pay | Admitting: Oncology

## 2023-02-16 ENCOUNTER — Telehealth: Payer: Self-pay

## 2023-02-16 ENCOUNTER — Inpatient Hospital Stay: Payer: Medicare Other

## 2023-02-16 ENCOUNTER — Telehealth: Payer: Self-pay | Admitting: Oncology

## 2023-02-16 ENCOUNTER — Inpatient Hospital Stay: Payer: Medicare Other | Attending: Oncology | Admitting: Oncology

## 2023-02-16 ENCOUNTER — Other Ambulatory Visit: Payer: Self-pay | Admitting: Oncology

## 2023-02-16 VITALS — BP 131/96 | HR 70 | Temp 98.1°F | Resp 16 | Ht 66.0 in | Wt 190.3 lb

## 2023-02-16 DIAGNOSIS — D508 Other iron deficiency anemias: Secondary | ICD-10-CM

## 2023-02-16 DIAGNOSIS — Z87891 Personal history of nicotine dependence: Secondary | ICD-10-CM | POA: Insufficient documentation

## 2023-02-16 DIAGNOSIS — D509 Iron deficiency anemia, unspecified: Secondary | ICD-10-CM | POA: Diagnosis not present

## 2023-02-16 DIAGNOSIS — D539 Nutritional anemia, unspecified: Secondary | ICD-10-CM

## 2023-02-16 LAB — CBC WITH DIFFERENTIAL (CANCER CENTER ONLY)
Abs Immature Granulocytes: 0.02 10*3/uL (ref 0.00–0.07)
Basophils Absolute: 0.1 10*3/uL (ref 0.0–0.1)
Basophils Relative: 1 %
Eosinophils Absolute: 0.3 10*3/uL (ref 0.0–0.5)
Eosinophils Relative: 5 %
HCT: 34.9 % — ABNORMAL LOW (ref 39.0–52.0)
Hemoglobin: 11.7 g/dL — ABNORMAL LOW (ref 13.0–17.0)
Immature Granulocytes: 0 %
Lymphocytes Relative: 36 %
Lymphs Abs: 2.5 10*3/uL (ref 0.7–4.0)
MCH: 25.7 pg — ABNORMAL LOW (ref 26.0–34.0)
MCHC: 33.5 g/dL (ref 30.0–36.0)
MCV: 76.7 fL — ABNORMAL LOW (ref 80.0–100.0)
Monocytes Absolute: 0.7 10*3/uL (ref 0.1–1.0)
Monocytes Relative: 10 %
Neutro Abs: 3.3 10*3/uL (ref 1.7–7.7)
Neutrophils Relative %: 48 %
Platelet Count: 208 10*3/uL (ref 150–400)
RBC: 4.55 MIL/uL (ref 4.22–5.81)
RDW: 17.3 % — ABNORMAL HIGH (ref 11.5–15.5)
WBC Count: 6.8 10*3/uL (ref 4.0–10.5)
nRBC: 0 % (ref 0.0–0.2)
nRBC: 0 /100{WBCs}

## 2023-02-16 LAB — CMP (CANCER CENTER ONLY)
ALT: 51 U/L — ABNORMAL HIGH (ref 0–44)
AST: 64 U/L — ABNORMAL HIGH (ref 15–41)
Albumin: 4.6 g/dL (ref 3.5–5.0)
Alkaline Phosphatase: 70 U/L (ref 38–126)
Anion gap: 12 (ref 5–15)
BUN: 21 mg/dL (ref 8–23)
CO2: 25 mmol/L (ref 22–32)
Calcium: 9.8 mg/dL (ref 8.9–10.3)
Chloride: 102 mmol/L (ref 98–111)
Creatinine: 1.16 mg/dL (ref 0.61–1.24)
GFR, Estimated: >60 mL/min
Glucose, Bld: 120 mg/dL — ABNORMAL HIGH (ref 70–99)
Potassium: 4.1 mmol/L (ref 3.5–5.1)
Sodium: 139 mmol/L (ref 135–145)
Total Bilirubin: 0.4 mg/dL
Total Protein: 8 g/dL (ref 6.5–8.1)

## 2023-02-16 LAB — IRON AND TIBC
Iron: 46 ug/dL (ref 45–182)
Saturation Ratios: 7 % — ABNORMAL LOW (ref 17.9–39.5)
TIBC: 659 ug/dL — ABNORMAL HIGH (ref 250–450)
UIBC: 613 ug/dL

## 2023-02-16 LAB — FERRITIN: Ferritin: 10 ng/mL — ABNORMAL LOW (ref 24–336)

## 2023-02-16 NOTE — Telephone Encounter (Signed)
Patient has been scheduled for follow-up visit per 02/16/23 LOS.  Pt given an appt calendar with date and time.

## 2023-02-16 NOTE — Telephone Encounter (Signed)
Dr Melvyn Neth reviewed labs and recommends pt receive iron infusions.   Latest Reference Range & Units 02/16/23 08:18  Iron 45 - 182 ug/dL 46  UIBC ug/dL 846  TIBC 962 - 952 ug/dL 841 (H)  Saturation Ratios 17.9 - 39.5 % 7 (L)  Ferritin 24 - 336 ng/mL 10 (L)  (H): Data is abnormally high (L): Data is abnormally low

## 2023-02-19 ENCOUNTER — Encounter: Payer: Self-pay | Admitting: Oncology

## 2023-02-19 DIAGNOSIS — D509 Iron deficiency anemia, unspecified: Secondary | ICD-10-CM | POA: Insufficient documentation

## 2023-02-20 ENCOUNTER — Telehealth: Payer: Self-pay | Admitting: Oncology

## 2023-02-20 NOTE — Telephone Encounter (Signed)
Patient has been scheduled. Aware of appt date and time.    Scheduling Message Entered by Domenic Schwab on 02/19/2023 at  9:16 AM Priority: Routine <No visit type provided>  Department: [facility]  Provider:  Appointment Notes:  Please schedule pt for 2 doses of feraheme

## 2023-02-22 ENCOUNTER — Encounter: Payer: Self-pay | Admitting: Oncology

## 2023-02-23 ENCOUNTER — Telehealth: Payer: Self-pay

## 2023-02-23 NOTE — Telephone Encounter (Signed)
 [facility] Clinical Social Work  Clinical Social Work was referred by nurse for assessment of psychosocial needs.  Clinical Social Worker attempted to contact patient by phone to offer support and assess for needs. CSW was unable to reach patient, left vm with direct contact.    Marguerita Merles, LCSW  Clinical Social Worker [facility]

## 2023-02-26 ENCOUNTER — Inpatient Hospital Stay: Payer: Medicare Other | Attending: Oncology

## 2023-02-26 ENCOUNTER — Telehealth: Payer: Self-pay

## 2023-02-26 DIAGNOSIS — D509 Iron deficiency anemia, unspecified: Secondary | ICD-10-CM | POA: Insufficient documentation

## 2023-02-26 NOTE — Telephone Encounter (Signed)
 Lizbeth Sprague, LCSW, sent me message that pt has a couple questions about iron infusions. I called pt. He wanted to know if he needed a driver after infusion? I replied, No. He wanted to know if the iron is given by IV? I explained the infusion is by IV and that the chemo nurses have lots of experience w/ starting IV's. He mentioned, I just don't like IV's too good, but I have given blood before.  Also, he wanted to know if he could take his am meds and eat? I answered Yes to both questions. I did mention that he will be required to stay approx 30 min to 1 hr after first infusion to watch for side effects. He verbalized understanding.

## 2023-02-26 NOTE — Progress Notes (Signed)
 [facility] Clinical Social Work  Clinical Social Work was referred by nurse for assessment of psychosocial needs.  Clinical Social Worker contacted patient by phone to offer support and assess for needs. CSW reviewed SDOH, patient has no concerns at this time. CSW confirmed patient's upcoming appointments as listed on EPIC, and informed Triage Nurse of patient's questions about iron infusions. No follow up at this time.  Lizbeth Sprague, LCSW  Clinical Social Worker [facility]

## 2023-02-28 ENCOUNTER — Other Ambulatory Visit: Payer: Self-pay | Admitting: Internal Medicine

## 2023-03-02 ENCOUNTER — Ambulatory Visit: Payer: Medicare Other

## 2023-03-09 ENCOUNTER — Inpatient Hospital Stay: Payer: Medicare Other

## 2023-03-09 VITALS — BP 139/79 | HR 70 | Temp 98.4°F | Resp 18 | Wt 195.0 lb

## 2023-03-09 DIAGNOSIS — D509 Iron deficiency anemia, unspecified: Secondary | ICD-10-CM | POA: Diagnosis not present

## 2023-03-09 DIAGNOSIS — D508 Other iron deficiency anemias: Secondary | ICD-10-CM

## 2023-03-09 MED ORDER — LORATADINE 10 MG PO TABS
10.0000 mg | ORAL_TABLET | Freq: Once | ORAL | Status: AC
Start: 2023-03-09 — End: 2023-03-09
  Administered 2023-03-09: 10 mg via ORAL
  Filled 2023-03-09: qty 1

## 2023-03-09 MED ORDER — SODIUM CHLORIDE 0.9 % IV SOLN
510.0000 mg | Freq: Once | INTRAVENOUS | Status: AC
  Administered 2023-03-09: 510 mg via INTRAVENOUS
  Filled 2023-03-09: qty 510

## 2023-03-09 MED ORDER — SODIUM CHLORIDE 0.9 % IV SOLN: INTRAVENOUS | Status: DC

## 2023-03-09 MED ORDER — ACETAMINOPHEN 325 MG PO TABS
650.0000 mg | ORAL_TABLET | Freq: Once | ORAL | Status: DC
Start: 2023-03-09 — End: 2023-03-09

## 2023-03-09 NOTE — Patient Instructions (Signed)
 Ferumoxytol Injection What is this medication? FERUMOXYTOL (FER ue MOX i tol) treats low levels of iron in your body (iron deficiency anemia). Iron is a mineral that plays an important role in making red blood cells, which carry oxygen from your lungs to the rest of your body. This medicine may be used for other purposes; ask your health care provider or pharmacist if you have questions. COMMON BRAND NAME(S): Feraheme What should I tell my care team before I take this medication? They need to know if you have any of these conditions: Anemia not caused by low iron levels High levels of iron in the blood Magnetic resonance imaging (MRI) test scheduled An unusual or allergic reaction to iron, other medications, foods, dyes, or preservatives Pregnant or trying to get pregnant Breastfeeding How should I use this medication? This medication is injected into a vein. It is given by your care team in a hospital or clinic setting. Talk to your care team the use of this medication in children. Special care may be needed. Overdosage: If you think you have taken too much of this medicine contact a poison control center or emergency room at once. NOTE: This medicine is only for you. Do not share this medicine with others. What if I miss a dose? It is important not to miss your dose. Call your care team if you are unable to keep an appointment. What may interact with this medication? Other iron products This list may not describe all possible interactions. Give your health care provider a list of all the medicines, herbs, non-prescription drugs, or dietary supplements you use. Also tell them if you smoke, drink alcohol, or use illegal drugs. Some items may interact with your medicine. What should I watch for while using this medication? Visit your care team regularly. Tell your care team if your symptoms do not start to get better or if they get worse. You may need blood work done while you are taking this  medication. You may need to follow a special diet. Talk to your care team. Foods that contain iron include: whole grains/cereals, dried fruits, beans, or peas, leafy green vegetables, and organ meats (liver, kidney). What side effects may I notice from receiving this medication? Side effects that you should report to your care team as soon as possible: Allergic reactions--skin rash, itching, hives, swelling of the face, lips, tongue, or throat Low blood pressure--dizziness, feeling faint or lightheaded, blurry vision Shortness of breath Side effects that usually do not require medical attention (report to your care team if they continue or are bothersome): Flushing Headache Joint pain Muscle pain Nausea Pain, redness, or irritation at injection site This list may not describe all possible side effects. Call your doctor for medical advice about side effects. You may report side effects to FDA at 1-800-FDA-1088. Where should I keep my medication? This medication is given in a hospital or clinic. It will not be stored at home. NOTE: This sheet is a summary. It may not cover all possible information. If you have questions about this medicine, talk to your doctor, pharmacist, or health care provider.  2024 Elsevier/Gold Standard (2022-07-18 00:00:00)

## 2023-03-16 ENCOUNTER — Inpatient Hospital Stay: Payer: Medicare Other

## 2023-03-16 VITALS — BP 121/81 | HR 70 | Temp 98.1°F | Resp 18

## 2023-03-16 DIAGNOSIS — D509 Iron deficiency anemia, unspecified: Secondary | ICD-10-CM | POA: Diagnosis not present

## 2023-03-16 DIAGNOSIS — D508 Other iron deficiency anemias: Secondary | ICD-10-CM

## 2023-03-16 MED ORDER — SODIUM CHLORIDE 0.9 % IV SOLN
510.0000 mg | Freq: Once | INTRAVENOUS | Status: AC
  Administered 2023-03-16: 510 mg via INTRAVENOUS
  Filled 2023-03-16: qty 510

## 2023-03-16 MED ORDER — SODIUM CHLORIDE 0.9 % IV SOLN: INTRAVENOUS | Status: DC | PRN

## 2023-03-16 NOTE — Patient Instructions (Signed)

## 2023-03-16 NOTE — Progress Notes (Signed)
Patient states he took tylenol and allergy medicine at 0700. Per Darl P[facility] patient does not need ordered premeds.

## 2023-04-09 DIAGNOSIS — H2513 Age-related nuclear cataract, bilateral: Secondary | ICD-10-CM | POA: Diagnosis not present

## 2023-04-20 DIAGNOSIS — K7581 Nonalcoholic steatohepatitis (NASH): Secondary | ICD-10-CM | POA: Diagnosis not present

## 2023-04-20 DIAGNOSIS — K7460 Unspecified cirrhosis of liver: Secondary | ICD-10-CM | POA: Diagnosis not present

## 2023-04-20 DIAGNOSIS — D509 Iron deficiency anemia, unspecified: Secondary | ICD-10-CM | POA: Diagnosis not present

## 2023-04-20 DIAGNOSIS — I10 Essential (primary) hypertension: Secondary | ICD-10-CM | POA: Diagnosis not present

## 2023-04-20 DIAGNOSIS — E782 Mixed hyperlipidemia: Secondary | ICD-10-CM | POA: Diagnosis not present

## 2023-04-20 DIAGNOSIS — I4510 Unspecified right bundle-branch block: Secondary | ICD-10-CM | POA: Diagnosis not present

## 2023-04-20 DIAGNOSIS — R7303 Prediabetes: Secondary | ICD-10-CM | POA: Diagnosis not present

## 2023-05-11 DIAGNOSIS — D649 Anemia, unspecified: Secondary | ICD-10-CM | POA: Diagnosis not present

## 2023-05-11 DIAGNOSIS — R748 Abnormal levels of other serum enzymes: Secondary | ICD-10-CM | POA: Diagnosis not present

## 2023-05-11 DIAGNOSIS — K219 Gastro-esophageal reflux disease without esophagitis: Secondary | ICD-10-CM | POA: Diagnosis not present

## 2023-05-11 DIAGNOSIS — D510 Vitamin B12 deficiency anemia due to intrinsic factor deficiency: Secondary | ICD-10-CM | POA: Diagnosis not present

## 2023-05-11 DIAGNOSIS — K7460 Unspecified cirrhosis of liver: Secondary | ICD-10-CM | POA: Diagnosis not present

## 2023-05-17 NOTE — Progress Notes (Unsigned)
 [facility]  [address] [place],  [place]  [address] [phone]  Clinic Day:  05/18/2023  Referring physician: Julianne Handler, NP  HISTORY OF PRESENT ILLNESS:  The patient is a 68 y.o. male with iron deficiency anemia.  He comes in today to reassess his iron and hemoglobin levels after receiving IV iron in January 2025.  The patient has definitely noticed an improvement in how he is felt since his IV iron was given.  He continues to deny having any overt forms of blood loss.  Of note, he is scheduled for both a colonoscopy and endoscopy in April 2025.  PHYSICAL EXAM:  Blood pressure (!) 134/91, pulse 68, temperature 98.3 F (36.8 C), temperature source Oral, resp. rate 16, height 5\' 6"  (1.676 m), weight 189 lb 8 oz (86 kg), SpO2 93%. Wt Readings from Last 3 Encounters:  05/18/23 189 lb 8 oz (86 kg)  03/09/23 195 lb (88.5 kg)  02/16/23 190 lb 4.8 oz (86.3 kg)   Body mass index is 30.59 kg/m. Performance status (ECOG): 1 - Symptomatic but completely ambulatory Physical Exam Constitutional:      Appearance: Normal appearance. He is not ill-appearing.  HENT:     Mouth/Throat:     Mouth: Mucous membranes are moist.     Pharynx: Oropharynx is clear. No oropharyngeal exudate or posterior oropharyngeal erythema.  Cardiovascular:     Rate and Rhythm: Normal rate and regular rhythm.     Heart sounds: No murmur heard.    No friction rub. No gallop.  Pulmonary:     Effort: Pulmonary effort is normal. No respiratory distress.     Breath sounds: Normal breath sounds. No wheezing, rhonchi or rales.  Abdominal:     General: Bowel sounds are normal. There is no distension.     Palpations: Abdomen is soft. There is no mass.     Tenderness: There is no abdominal tenderness.  Musculoskeletal:        General: No swelling.     Right lower leg: No edema.     Left lower leg: No edema.  Lymphadenopathy:     Cervical: No cervical adenopathy.     Upper Body:      Right upper body: No supraclavicular or axillary adenopathy.     Left upper body: No supraclavicular or axillary adenopathy.     Lower Body: No right inguinal adenopathy. No left inguinal adenopathy.  Skin:    General: Skin is warm.     Coloration: Skin is not jaundiced.     Findings: No lesion or rash.  Neurological:     General: No focal deficit present.     Mental Status: He is alert and oriented to person, place, and time. Mental status is at baseline.  Psychiatric:        Mood and Affect: Mood normal.        Behavior: Behavior normal.        Thought Content: Thought content normal.   LABS:      Latest Ref Rng & Units 05/18/2023    8:25 AM 02/16/2023    8:17 AM 05/29/2021    9:56 AM  CBC  WBC 4.0 - 10.5 K/uL 5.9  6.8  7.1   Hemoglobin 13.0 - 17.0 g/dL 62.9  52.8  41.3   Hematocrit 39.0 - 52.0 % 41.3  34.9  44.2   Platelets 150 - 400 K/uL 146  208  169       Latest  Ref Rng & Units 05/18/2023    8:25 AM 02/16/2023    8:17 AM 10/17/2021    8:51 AM  CMP  Glucose 70 - 99 mg/dL 191  478    BUN 8 - 23 mg/dL 20  21    Creatinine 2.95 - 1.24 mg/dL 6.21  3.08    Sodium 657 - 145 mmol/L 140  139    Potassium 3.5 - 5.1 mmol/L 4.1  4.1    Chloride 98 - 111 mmol/L 102  102    CO2 22 - 32 mmol/L 24  25    Calcium 8.9 - 10.3 mg/dL 84.6  9.8    Total Protein 6.5 - 8.1 g/dL 7.6  8.0  7.5   Total Bilirubin 0.0 - 1.2 mg/dL 0.6  0.4  0.5   Alkaline Phos 38 - 126 U/L 114  70  51   AST 15 - 41 U/L 98  64  56   ALT 0 - 44 U/L 98  51  49     Latest Reference Range & Units 02/16/23 08:18 05/18/23 08:25  Iron 45 - 182 ug/dL 46 962  UIBC ug/dL 952 841  TIBC 324 - 401 ug/dL 027 (H) 253 (H)  Saturation Ratios 17.9 - 39.5 % 7 (L) 27  Ferritin 24 - 336 ng/mL 10 (L) 130  (H): Data is abnormally high (L): Data is abnormally low  ASSESSMENT & PLAN:  A 68 y.o. male with iron deficiency anemia.  I am very pleased with the improvement in his hemoglobin since his IV iron was recently given.   Clinically, he appears to be doing much better.  As mentioned previously, he is scheduled for a GI workup in the forthcoming weeks.  I will see him back in 6 months for repeat clinical assessment.  The patient understands all the plans discussed today and is in agreement with them.  Makaylah Oddo Kirby Funk, MD

## 2023-05-18 ENCOUNTER — Inpatient Hospital Stay: Payer: Medicare Other

## 2023-05-18 ENCOUNTER — Telehealth: Payer: Self-pay | Admitting: Oncology

## 2023-05-18 ENCOUNTER — Other Ambulatory Visit: Payer: Self-pay | Admitting: Oncology

## 2023-05-18 ENCOUNTER — Inpatient Hospital Stay: Payer: Medicare Other | Attending: Oncology | Admitting: Oncology

## 2023-05-18 VITALS — BP 134/91 | HR 68 | Temp 98.3°F | Resp 16 | Ht 66.0 in | Wt 189.5 lb

## 2023-05-18 DIAGNOSIS — D508 Other iron deficiency anemias: Secondary | ICD-10-CM

## 2023-05-18 DIAGNOSIS — D509 Iron deficiency anemia, unspecified: Secondary | ICD-10-CM | POA: Diagnosis not present

## 2023-05-18 LAB — IRON AND TIBC
Iron: 123 ug/dL (ref 45–182)
Saturation Ratios: 27 % (ref 17.9–39.5)
TIBC: 452 ug/dL — ABNORMAL HIGH (ref 250–450)
UIBC: 329 ug/dL

## 2023-05-18 LAB — CBC WITH DIFFERENTIAL (CANCER CENTER ONLY)
Abs Immature Granulocytes: 0.01 10*3/uL (ref 0.00–0.07)
Basophils Absolute: 0 10*3/uL (ref 0.0–0.1)
Basophils Relative: 1 %
Eosinophils Absolute: 0.2 10*3/uL (ref 0.0–0.5)
Eosinophils Relative: 3 %
HCT: 41.3 % (ref 39.0–52.0)
Hemoglobin: 14.4 g/dL (ref 13.0–17.0)
Immature Granulocytes: 0 %
Immature Platelet Fraction: 1.4 % (ref 1.2–8.6)
Lymphocytes Relative: 36 %
Lymphs Abs: 2.2 10*3/uL (ref 0.7–4.0)
MCH: 30.6 pg (ref 26.0–34.0)
MCHC: 34.9 g/dL (ref 30.0–36.0)
MCV: 87.7 fL (ref 80.0–100.0)
Monocytes Absolute: 0.6 10*3/uL (ref 0.1–1.0)
Monocytes Relative: 10 %
Neutro Abs: 3 10*3/uL (ref 1.7–7.7)
Neutrophils Relative %: 50 %
Platelet Count: 146 10*3/uL — ABNORMAL LOW (ref 150–400)
RBC: 4.71 MIL/uL (ref 4.22–5.81)
RDW: 19.3 % — ABNORMAL HIGH (ref 11.5–15.5)
WBC Count: 5.9 10*3/uL (ref 4.0–10.5)
nRBC: 0 % (ref 0.0–0.2)
nRBC: 0 /100{WBCs}

## 2023-05-18 LAB — CMP (CANCER CENTER ONLY)
ALT: 98 U/L — ABNORMAL HIGH (ref 0–44)
AST: 98 U/L — ABNORMAL HIGH (ref 15–41)
Albumin: 4.5 g/dL (ref 3.5–5.0)
Alkaline Phosphatase: 114 U/L (ref 38–126)
Anion gap: 14 (ref 5–15)
BUN: 20 mg/dL (ref 8–23)
CO2: 24 mmol/L (ref 22–32)
Calcium: 10 mg/dL (ref 8.9–10.3)
Chloride: 102 mmol/L (ref 98–111)
Creatinine: 0.93 mg/dL (ref 0.61–1.24)
GFR, Estimated: >60 mL/min (ref >60)
Glucose, Bld: 131 mg/dL — ABNORMAL HIGH (ref 70–99)
Potassium: 4.1 mmol/L (ref 3.5–5.1)
Sodium: 140 mmol/L (ref 135–145)
Total Bilirubin: 0.6 mg/dL (ref 0.0–1.2)
Total Protein: 7.6 g/dL (ref 6.5–8.1)

## 2023-05-18 LAB — FERRITIN: Ferritin: 130 ng/mL (ref 24–336)

## 2023-05-18 NOTE — Telephone Encounter (Signed)
 05/18/23 Spoke with patient and confirmed next appt.

## 2023-05-22 ENCOUNTER — Other Ambulatory Visit (HOSPITAL_BASED_OUTPATIENT_CLINIC_OR_DEPARTMENT_OTHER): Payer: Self-pay | Admitting: Nurse Practitioner

## 2023-05-22 DIAGNOSIS — Z789 Other specified health status: Secondary | ICD-10-CM

## 2023-05-22 DIAGNOSIS — K7469 Other cirrhosis of liver: Secondary | ICD-10-CM | POA: Diagnosis not present

## 2023-05-22 DIAGNOSIS — K7581 Nonalcoholic steatohepatitis (NASH): Secondary | ICD-10-CM

## 2023-05-22 DIAGNOSIS — R748 Abnormal levels of other serum enzymes: Secondary | ICD-10-CM | POA: Diagnosis not present

## 2023-05-22 DIAGNOSIS — Z1389 Encounter for screening for other disorder: Secondary | ICD-10-CM | POA: Diagnosis not present

## 2023-06-01 ENCOUNTER — Ambulatory Visit (HOSPITAL_BASED_OUTPATIENT_CLINIC_OR_DEPARTMENT_OTHER)
Admission: RE | Admit: 2023-06-01 | Discharge: 2023-06-01 | Disposition: A | Discharge status: Home / Self Care | Source: Ambulatory Visit | Attending: Nurse Practitioner | Admitting: Radiology

## 2023-06-01 DIAGNOSIS — K7469 Other cirrhosis of liver: Secondary | ICD-10-CM

## 2023-06-01 DIAGNOSIS — K760 Fatty (change of) liver, not elsewhere classified: Secondary | ICD-10-CM | POA: Diagnosis not present

## 2023-06-01 DIAGNOSIS — Z789 Other specified health status: Secondary | ICD-10-CM

## 2023-06-01 DIAGNOSIS — R748 Abnormal levels of other serum enzymes: Secondary | ICD-10-CM | POA: Diagnosis not present

## 2023-06-01 DIAGNOSIS — K7581 Nonalcoholic steatohepatitis (NASH): Secondary | ICD-10-CM

## 2023-06-01 DIAGNOSIS — R7989 Other specified abnormal findings of blood chemistry: Secondary | ICD-10-CM | POA: Diagnosis not present

## 2023-06-01 DIAGNOSIS — K7460 Unspecified cirrhosis of liver: Secondary | ICD-10-CM | POA: Diagnosis not present

## 2023-06-01 DIAGNOSIS — N200 Calculus of kidney: Secondary | ICD-10-CM | POA: Diagnosis not present

## 2023-06-26 DIAGNOSIS — D500 Iron deficiency anemia secondary to blood loss (chronic): Secondary | ICD-10-CM | POA: Diagnosis not present

## 2023-06-26 DIAGNOSIS — K219 Gastro-esophageal reflux disease without esophagitis: Secondary | ICD-10-CM | POA: Diagnosis not present

## 2023-06-26 DIAGNOSIS — K3189 Other diseases of stomach and duodenum: Secondary | ICD-10-CM | POA: Diagnosis not present

## 2023-06-26 DIAGNOSIS — K31819 Angiodysplasia of stomach and duodenum without bleeding: Secondary | ICD-10-CM | POA: Diagnosis not present

## 2023-06-26 DIAGNOSIS — D509 Iron deficiency anemia, unspecified: Secondary | ICD-10-CM | POA: Diagnosis not present

## 2023-06-26 DIAGNOSIS — K449 Diaphragmatic hernia without obstruction or gangrene: Secondary | ICD-10-CM | POA: Diagnosis not present

## 2023-06-26 DIAGNOSIS — Z1211 Encounter for screening for malignant neoplasm of colon: Secondary | ICD-10-CM | POA: Diagnosis not present

## 2023-06-26 DIAGNOSIS — I10 Essential (primary) hypertension: Secondary | ICD-10-CM | POA: Diagnosis not present

## 2023-06-26 DIAGNOSIS — K5730 Diverticulosis of large intestine without perforation or abscess without bleeding: Secondary | ICD-10-CM | POA: Diagnosis not present

## 2023-09-23 DIAGNOSIS — M19011 Primary osteoarthritis, right shoulder: Secondary | ICD-10-CM | POA: Diagnosis not present

## 2023-09-23 DIAGNOSIS — M25851 Other specified joint disorders, right hip: Secondary | ICD-10-CM | POA: Diagnosis not present

## 2023-09-23 DIAGNOSIS — M25551 Pain in right hip: Secondary | ICD-10-CM | POA: Diagnosis not present

## 2023-10-12 DIAGNOSIS — M25551 Pain in right hip: Secondary | ICD-10-CM | POA: Diagnosis not present

## 2023-10-14 DIAGNOSIS — M25551 Pain in right hip: Secondary | ICD-10-CM | POA: Diagnosis not present

## 2023-10-28 DIAGNOSIS — M25851 Other specified joint disorders, right hip: Secondary | ICD-10-CM | POA: Diagnosis not present

## 2023-10-28 DIAGNOSIS — M25551 Pain in right hip: Secondary | ICD-10-CM | POA: Diagnosis not present

## 2023-10-30 ENCOUNTER — Other Ambulatory Visit (HOSPITAL_BASED_OUTPATIENT_CLINIC_OR_DEPARTMENT_OTHER): Payer: Self-pay | Admitting: Family Medicine

## 2023-10-30 DIAGNOSIS — M25551 Pain in right hip: Secondary | ICD-10-CM

## 2023-11-02 DIAGNOSIS — K7460 Unspecified cirrhosis of liver: Secondary | ICD-10-CM | POA: Diagnosis not present

## 2023-11-03 DIAGNOSIS — D649 Anemia, unspecified: Secondary | ICD-10-CM | POA: Diagnosis not present

## 2023-11-11 ENCOUNTER — Ambulatory Visit (INDEPENDENT_AMBULATORY_CARE_PROVIDER_SITE_OTHER)
Admission: RE | Admit: 2023-11-11 | Discharge: 2023-11-11 | Disposition: A | Discharge status: Home / Self Care | Source: Ambulatory Visit | Attending: Family Medicine | Admitting: Family Medicine

## 2023-11-11 DIAGNOSIS — M25451 Effusion, right hip: Secondary | ICD-10-CM | POA: Diagnosis not present

## 2023-11-11 DIAGNOSIS — M25551 Pain in right hip: Secondary | ICD-10-CM

## 2023-11-11 DIAGNOSIS — M47816 Spondylosis without myelopathy or radiculopathy, lumbar region: Secondary | ICD-10-CM | POA: Diagnosis not present

## 2023-11-11 DIAGNOSIS — R600 Localized edema: Secondary | ICD-10-CM | POA: Diagnosis not present

## 2023-11-19 ENCOUNTER — Other Ambulatory Visit: Payer: Self-pay

## 2023-11-22 NOTE — Progress Notes (Unsigned)
 [facility]  [address] [place],  [place]  [address] [phone]  Clinic Day:  05/18/2023  Referring physician: Benjamine Lauraine DASEN, NP  HISTORY OF PRESENT ILLNESS:  The patient is a 68 y.o. male with iron deficiency anemia.  He comes in today to reassess his iron and hemoglobin levels after receiving IV iron in January 2025.  The patient has definitely noticed an improvement in how he is felt since his IV iron was given.  He continues to deny having any overt forms of blood loss.    Of note, he is scheduled for both a colonoscopy and endoscopy in April 2025.  PHYSICAL EXAM:  There were no vitals taken for this visit. Wt Readings from Last 3 Encounters:  05/18/23 189 lb 8 oz (86 kg)  03/09/23 195 lb (88.5 kg)  02/16/23 190 lb 4.8 oz (86.3 kg)   There is no height or weight on file to calculate BMI. Performance status (ECOG): 1 - Symptomatic but completely ambulatory Physical Exam Constitutional:      Appearance: Normal appearance. He is not ill-appearing.  HENT:     Mouth/Throat:     Mouth: Mucous membranes are moist.     Pharynx: Oropharynx is clear. No oropharyngeal exudate or posterior oropharyngeal erythema.  Cardiovascular:     Rate and Rhythm: Normal rate and regular rhythm.     Heart sounds: No murmur heard.    No friction rub. No gallop.  Pulmonary:     Effort: Pulmonary effort is normal. No respiratory distress.     Breath sounds: Normal breath sounds. No wheezing, rhonchi or rales.  Abdominal:     General: Bowel sounds are normal. There is no distension.     Palpations: Abdomen is soft. There is no mass.     Tenderness: There is no abdominal tenderness.  Musculoskeletal:        General: No swelling.     Right lower leg: No edema.     Left lower leg: No edema.  Lymphadenopathy:     Cervical: No cervical adenopathy.     Upper Body:     Right upper body: No supraclavicular or axillary adenopathy.     Left upper body: No  supraclavicular or axillary adenopathy.     Lower Body: No right inguinal adenopathy. No left inguinal adenopathy.  Skin:    General: Skin is warm.     Coloration: Skin is not jaundiced.     Findings: No lesion or rash.  Neurological:     General: No focal deficit present.     Mental Status: He is alert and oriented to person, place, and time. Mental status is at baseline.  Psychiatric:        Mood and Affect: Mood normal.        Behavior: Behavior normal.        Thought Content: Thought content normal.    LABS:      Latest Ref Rng & Units 05/18/2023    8:25 AM 02/16/2023    8:17 AM 05/29/2021    9:56 AM  CBC  WBC 4.0 - 10.5 K/uL 5.9  6.8  7.1   Hemoglobin 13.0 - 17.0 g/dL 85.5  88.2  84.4   Hematocrit 39.0 - 52.0 % 41.3  34.9  44.2   Platelets 150 - 400 K/uL 146  208  169       Latest Ref Rng & Units 05/18/2023    8:25 AM 02/16/2023    8:17  AM 10/17/2021    8:51 AM  CMP  Glucose 70 - 99 mg/dL 868  879    BUN 8 - 23 mg/dL 20  21    Creatinine 9.38 - 1.24 mg/dL 9.06  8.83    Sodium 864 - 145 mmol/L 140  139    Potassium 3.5 - 5.1 mmol/L 4.1  4.1    Chloride 98 - 111 mmol/L 102  102    CO2 22 - 32 mmol/L 24  25    Calcium  8.9 - 10.3 mg/dL 89.9  9.8    Total Protein 6.5 - 8.1 g/dL 7.6  8.0  7.5   Total Bilirubin 0.0 - 1.2 mg/dL 0.6  0.4  0.5   Alkaline Phos 38 - 126 U/L 114  70  51   AST 15 - 41 U/L 98  64  56   ALT 0 - 44 U/L 98  51  49     Latest Reference Range & Units 02/16/23 08:18 05/18/23 08:25  Iron 45 - 182 ug/dL 46 876  UIBC ug/dL 386 670  TIBC 749 - 549 ug/dL 340 (H) 547 (H)  Saturation Ratios 17.9 - 39.5 % 7 (L) 27  Ferritin 24 - 336 ng/mL 10 (L) 130  (H): Data is abnormally high (L): Data is abnormally low  ASSESSMENT & PLAN:  A 68 y.o. male with iron deficiency anemia.  I am very pleased with the improvement in his hemoglobin since his IV iron was recently given.  Clinically, he appears to be doing much better.  As mentioned previously, he is scheduled  for a GI workup in the forthcoming weeks.  I will see him back in 6 months for repeat clinical assessment.  The patient understands all the plans discussed today and is in agreement with them.  Dorion Petillo DELENA Kerns, MD

## 2023-11-23 ENCOUNTER — Other Ambulatory Visit: Payer: Self-pay | Admitting: Medical Genetics

## 2023-11-23 ENCOUNTER — Inpatient Hospital Stay

## 2023-11-23 ENCOUNTER — Inpatient Hospital Stay: Attending: Oncology | Admitting: Oncology

## 2023-11-23 ENCOUNTER — Other Ambulatory Visit: Payer: Self-pay | Admitting: Oncology

## 2023-11-23 VITALS — BP 120/88 | HR 71 | Temp 97.9°F | Resp 16 | Ht 66.0 in | Wt 178.6 lb

## 2023-11-23 DIAGNOSIS — D508 Other iron deficiency anemias: Secondary | ICD-10-CM

## 2023-11-23 DIAGNOSIS — D696 Thrombocytopenia, unspecified: Secondary | ICD-10-CM | POA: Insufficient documentation

## 2023-11-23 DIAGNOSIS — D509 Iron deficiency anemia, unspecified: Secondary | ICD-10-CM | POA: Insufficient documentation

## 2023-11-23 DIAGNOSIS — K7581 Nonalcoholic steatohepatitis (NASH): Secondary | ICD-10-CM | POA: Diagnosis not present

## 2023-11-23 LAB — CBC WITH DIFFERENTIAL (CANCER CENTER ONLY)
Abs Immature Granulocytes: 0.01 K/uL (ref 0.00–0.07)
Basophils Absolute: 0 K/uL (ref 0.0–0.1)
Basophils Relative: 0 %
Eosinophils Absolute: 0.2 K/uL (ref 0.0–0.5)
Eosinophils Relative: 2 %
HCT: 42.4 % (ref 39.0–52.0)
Hemoglobin: 15.1 g/dL (ref 13.0–17.0)
Immature Granulocytes: 0 %
Lymphocytes Relative: 32 %
Lymphs Abs: 2.4 K/uL (ref 0.7–4.0)
MCH: 32.8 pg (ref 26.0–34.0)
MCHC: 35.6 g/dL (ref 30.0–36.0)
MCV: 92.2 fL (ref 80.0–100.0)
Monocytes Absolute: 0.6 K/uL (ref 0.1–1.0)
Monocytes Relative: 8 %
Neutro Abs: 4.3 K/uL (ref 1.7–7.7)
Neutrophils Relative %: 58 %
Platelet Count: 118 K/uL — ABNORMAL LOW (ref 150–400)
RBC: 4.6 MIL/uL (ref 4.22–5.81)
RDW: 13 % (ref 11.5–15.5)
WBC Count: 7.5 K/uL (ref 4.0–10.5)
nRBC: 0 % (ref 0.0–0.2)

## 2023-11-23 LAB — FERRITIN: Ferritin: 55 ng/mL (ref 24–336)

## 2023-11-23 LAB — IRON AND TIBC
Iron: 160 ug/dL (ref 45–182)
Saturation Ratios: 37 % (ref 17.9–39.5)
TIBC: 437 ug/dL (ref 250–450)
UIBC: 277 ug/dL

## 2023-11-30 DIAGNOSIS — M1611 Unilateral primary osteoarthritis, right hip: Secondary | ICD-10-CM | POA: Diagnosis not present

## 2023-12-07 DIAGNOSIS — K7402 Hepatic fibrosis, advanced fibrosis: Secondary | ICD-10-CM | POA: Diagnosis not present

## 2023-12-07 DIAGNOSIS — R748 Abnormal levels of other serum enzymes: Secondary | ICD-10-CM | POA: Diagnosis not present

## 2023-12-07 DIAGNOSIS — K7581 Nonalcoholic steatohepatitis (NASH): Secondary | ICD-10-CM | POA: Diagnosis not present

## 2023-12-09 ENCOUNTER — Other Ambulatory Visit (HOSPITAL_BASED_OUTPATIENT_CLINIC_OR_DEPARTMENT_OTHER): Payer: Self-pay | Admitting: Nurse Practitioner

## 2023-12-09 ENCOUNTER — Encounter: Payer: Self-pay | Admitting: Oncology

## 2023-12-09 DIAGNOSIS — K7581 Nonalcoholic steatohepatitis (NASH): Secondary | ICD-10-CM

## 2023-12-14 ENCOUNTER — Ambulatory Visit (INDEPENDENT_AMBULATORY_CARE_PROVIDER_SITE_OTHER)
Admission: RE | Admit: 2023-12-14 | Discharge: 2023-12-14 | Disposition: A | Discharge status: Home / Self Care | Source: Ambulatory Visit | Attending: Nurse Practitioner | Admitting: Nurse Practitioner

## 2023-12-14 DIAGNOSIS — K7689 Other specified diseases of liver: Secondary | ICD-10-CM | POA: Diagnosis not present

## 2023-12-14 DIAGNOSIS — K7581 Nonalcoholic steatohepatitis (NASH): Secondary | ICD-10-CM

## 2023-12-14 DIAGNOSIS — K760 Fatty (change of) liver, not elsewhere classified: Secondary | ICD-10-CM | POA: Diagnosis not present

## 2023-12-29 ENCOUNTER — Other Ambulatory Visit

## 2023-12-29 DIAGNOSIS — Z006 Encounter for examination for normal comparison and control in clinical research program: Secondary | ICD-10-CM

## 2024-01-08 LAB — GENECONNECT MOLECULAR SCREEN: Genetic Analysis Overall Interpretation: NEGATIVE

## 2024-02-15 ENCOUNTER — Other Ambulatory Visit: Payer: Self-pay | Admitting: Internal Medicine

## 2024-03-11 ENCOUNTER — Other Ambulatory Visit: Payer: Self-pay | Admitting: Internal Medicine

## 2024-03-15 ENCOUNTER — Telehealth: Payer: Self-pay | Admitting: Internal Medicine

## 2024-03-15 NOTE — Telephone Encounter (Signed)
" °*  STAT* If patient is at the pharmacy, call can be transferred to refill team.   1. Which medications need to be refilled? (please list name of each medication and dose if known)   losartan  (COZAAR ) 50 MG tablet     2. Would you like to learn more about the convenience, safety, & potential cost savings by using the [facility]? No    3. Are you open to using the [facility] (Type [facility]. No    4. Which pharmacy/location (including street and city if local pharmacy) is medication to be sent to?[id number] - [place], [place] - [address]    5. Do they need a 30 day or 90 day supply? 90 day   "

## 2024-03-22 MED ORDER — LOSARTAN POTASSIUM 50 MG PO TABS: 50.0000 mg | ORAL_TABLET | Freq: Every day | ORAL | 0 refills | Status: AC

## 2024-03-22 NOTE — Telephone Encounter (Signed)
 Refill for 30 days was sent 03/14/24.  Pt scheduled to see Katlyn West, NP, 04/25/24, will send in another supply to get pt to appointment.

## 2024-04-25 ENCOUNTER — Ambulatory Visit: Admitting: Cardiology

## 2024-05-23 ENCOUNTER — Other Ambulatory Visit

## 2024-05-23 ENCOUNTER — Ambulatory Visit: Admitting: Oncology
